# Patient Record
Sex: Male | Born: 1970 | Race: White | Hispanic: No | Marital: Married | Smoking: Current every day smoker
Health system: Southern US, Community
[De-identification: ages and names within clinical notes are randomized; demographics above are authoritative.]

## PROBLEM LIST (undated history)

## (undated) DIAGNOSIS — N200 Calculus of kidney: Secondary | ICD-10-CM

## (undated) DIAGNOSIS — I10 Essential (primary) hypertension: Secondary | ICD-10-CM

## (undated) DIAGNOSIS — K219 Gastro-esophageal reflux disease without esophagitis: Secondary | ICD-10-CM

## (undated) DIAGNOSIS — I499 Cardiac arrhythmia, unspecified: Secondary | ICD-10-CM

## (undated) DIAGNOSIS — E785 Hyperlipidemia, unspecified: Secondary | ICD-10-CM

## (undated) DIAGNOSIS — R109 Unspecified abdominal pain: Secondary | ICD-10-CM

## (undated) DIAGNOSIS — I4891 Unspecified atrial fibrillation: Secondary | ICD-10-CM

## (undated) HISTORY — DX: Calculus of kidney: N20.0

## (undated) HISTORY — DX: Unspecified atrial fibrillation: I48.91

## (undated) HISTORY — DX: Cardiac arrhythmia, unspecified: I49.9

## (undated) HISTORY — PX: KNEE SURGERY: SHX244

## (undated) HISTORY — DX: Unspecified abdominal pain: R10.9

## (undated) HISTORY — DX: Hyperlipidemia, unspecified: E78.5

## (undated) HISTORY — DX: Gastro-esophageal reflux disease without esophagitis: K21.9

---

## 2004-09-19 ENCOUNTER — Other Ambulatory Visit: Payer: Self-pay

## 2004-09-19 ENCOUNTER — Inpatient Hospital Stay: Payer: Self-pay | Admitting: Internal Medicine

## 2004-09-20 ENCOUNTER — Other Ambulatory Visit: Payer: Self-pay

## 2004-09-26 ENCOUNTER — Ambulatory Visit: Payer: Self-pay | Admitting: Internal Medicine

## 2006-11-14 ENCOUNTER — Emergency Department: Payer: Self-pay | Admitting: Emergency Medicine

## 2007-06-13 ENCOUNTER — Ambulatory Visit: Payer: Self-pay | Admitting: Internal Medicine

## 2007-06-25 ENCOUNTER — Other Ambulatory Visit: Payer: Self-pay

## 2007-06-26 ENCOUNTER — Observation Stay: Payer: Self-pay | Admitting: Internal Medicine

## 2012-07-07 ENCOUNTER — Emergency Department: Payer: Self-pay | Admitting: Emergency Medicine

## 2012-07-07 LAB — COMPREHENSIVE METABOLIC PANEL
Alkaline Phosphatase: 142 U/L — ABNORMAL HIGH (ref 50–136)
Calcium, Total: 9.2 mg/dL (ref 8.5–10.1)
Chloride: 107 mmol/L (ref 98–107)
Creatinine: 0.64 mg/dL (ref 0.60–1.30)
Glucose: 108 mg/dL — ABNORMAL HIGH (ref 65–99)
Osmolality: 284 (ref 275–301)
SGOT(AST): 25 U/L (ref 15–37)
Sodium: 141 mmol/L (ref 136–145)

## 2012-07-07 LAB — ETHANOL: Ethanol: <3 mg/dL

## 2012-07-07 LAB — CBC
HCT: 44.8 % (ref 40.0–52.0)
HGB: 15.4 g/dL (ref 13.0–18.0)
RDW: 13.7 % (ref 11.5–14.5)

## 2012-07-07 LAB — TROPONIN I: Troponin-I: <0.02 ng/mL

## 2012-07-07 LAB — CK TOTAL AND CKMB (NOT AT ARMC)
CK, Total: 117 U/L (ref 35–232)
CK-MB: 0.6 ng/mL (ref 0.5–3.6)

## 2012-07-07 LAB — LIPASE, BLOOD: Lipase: 48 U/L — ABNORMAL LOW (ref 73–393)

## 2013-08-11 ENCOUNTER — Ambulatory Visit: Payer: Self-pay | Admitting: Internal Medicine

## 2014-03-01 DIAGNOSIS — R10816 Epigastric abdominal tenderness: Secondary | ICD-10-CM

## 2014-03-01 DIAGNOSIS — R1031 Right lower quadrant pain: Secondary | ICD-10-CM

## 2014-03-01 LAB — CBC WITH DIFFERENTIAL/PLATELET
Basophil #: 0 10*3/uL (ref 0.0–0.1)
Basophil %: 0.3 %
Eosinophil #: 0.4 10*3/uL (ref 0.0–0.7)
Eosinophil %: 3.3 %
HCT: 48.1 % (ref 40.0–52.0)
HGB: 16.7 g/dL (ref 13.0–18.0)
Lymphocyte #: 2.6 10*3/uL (ref 1.0–3.6)
Lymphocyte %: 21.9 %
MCH: 30.2 pg (ref 26.0–34.0)
MCHC: 34.6 g/dL (ref 32.0–36.0)
MCV: 87 fL (ref 80–100)
MONO ABS: 0.8 x10 3/mm (ref 0.2–1.0)
MONOS PCT: 6.6 %
NEUTROS ABS: 7.9 10*3/uL — AB (ref 1.4–6.5)
NEUTROS PCT: 67.9 %
Platelet: 235 10*3/uL (ref 150–440)
RBC: 5.51 10*6/uL (ref 4.40–5.90)
RDW: 13.2 % (ref 11.5–14.5)
WBC: 11.7 10*3/uL — AB (ref 3.8–10.6)

## 2014-03-01 LAB — BASIC METABOLIC PANEL
Anion Gap: 8 (ref 7–16)
BUN: 21 mg/dL — ABNORMAL HIGH (ref 7–18)
CALCIUM: 8.7 mg/dL (ref 8.5–10.1)
CO2: 28 mmol/L (ref 21–32)
CREATININE: 0.73 mg/dL (ref 0.60–1.30)
Chloride: 103 mmol/L (ref 98–107)
EGFR (Non-African Amer.): >60
GLUCOSE: 89 mg/dL (ref 65–99)
OSMOLALITY: 280 (ref 275–301)
Potassium: 3.1 mmol/L — ABNORMAL LOW (ref 3.5–5.1)
Sodium: 139 mmol/L (ref 136–145)

## 2014-03-02 ENCOUNTER — Encounter: Payer: Self-pay | Admitting: General Surgery

## 2014-03-02 ENCOUNTER — Inpatient Hospital Stay: Payer: Self-pay | Admitting: General Surgery

## 2014-03-02 LAB — CBC WITH DIFFERENTIAL/PLATELET
Basophil #: 0 10*3/uL (ref 0.0–0.1)
Basophil %: 0.1 %
Eosinophil #: 0.2 10*3/uL (ref 0.0–0.7)
Eosinophil %: 1.9 %
HCT: 44.3 % (ref 40.0–52.0)
HGB: 14.9 g/dL (ref 13.0–18.0)
Lymphocyte #: 1.4 10*3/uL (ref 1.0–3.6)
Lymphocyte %: 11.6 %
MCH: 29.9 pg (ref 26.0–34.0)
MCHC: 33.6 g/dL (ref 32.0–36.0)
MCV: 89 fL (ref 80–100)
Monocyte #: 0.7 x10 3/mm (ref 0.2–1.0)
Monocyte %: 5.8 %
Neutrophil #: 9.4 10*3/uL — ABNORMAL HIGH (ref 1.4–6.5)
Neutrophil %: 80.6 %
Platelet: 204 10*3/uL (ref 150–440)
RBC: 4.99 10*6/uL (ref 4.40–5.90)
RDW: 12.9 % (ref 11.5–14.5)
WBC: 11.7 10*3/uL — ABNORMAL HIGH (ref 3.8–10.6)

## 2014-03-02 LAB — LIPASE, BLOOD: Lipase: 52 U/L — ABNORMAL LOW (ref 73–393)

## 2014-03-02 LAB — POTASSIUM: Potassium: 3.3 mmol/L — ABNORMAL LOW (ref 3.5–5.1)

## 2014-03-04 LAB — CBC WITH DIFFERENTIAL/PLATELET
BASOS PCT: 0.3 %
Basophil #: 0 10*3/uL (ref 0.0–0.1)
EOS ABS: 0.3 10*3/uL (ref 0.0–0.7)
Eosinophil %: 3.4 %
HCT: 38.7 % — ABNORMAL LOW (ref 40.0–52.0)
HGB: 13.7 g/dL (ref 13.0–18.0)
Lymphocyte #: 2.3 10*3/uL (ref 1.0–3.6)
Lymphocyte %: 29.5 %
MCH: 30.6 pg (ref 26.0–34.0)
MCHC: 35.4 g/dL (ref 32.0–36.0)
MCV: 87 fL (ref 80–100)
Monocyte #: 0.7 x10 3/mm (ref 0.2–1.0)
Monocyte %: 8.5 %
Neutrophil #: 4.5 10*3/uL (ref 1.4–6.5)
Neutrophil %: 58.3 %
PLATELETS: 205 10*3/uL (ref 150–440)
RBC: 4.48 10*6/uL (ref 4.40–5.90)
RDW: 13 % (ref 11.5–14.5)
WBC: 7.7 10*3/uL (ref 3.8–10.6)

## 2014-03-04 LAB — BASIC METABOLIC PANEL
ANION GAP: 6 — AB (ref 7–16)
BUN: 9 mg/dL (ref 7–18)
CO2: 32 mmol/L (ref 21–32)
Calcium, Total: 7.7 mg/dL — ABNORMAL LOW (ref 8.5–10.1)
Chloride: 102 mmol/L (ref 98–107)
Creatinine: 0.79 mg/dL (ref 0.60–1.30)
EGFR (African American): >60
EGFR (Non-African Amer.): >60
Glucose: 106 mg/dL — ABNORMAL HIGH (ref 65–99)
OSMOLALITY: 279 (ref 275–301)
POTASSIUM: 3.4 mmol/L — AB (ref 3.5–5.1)
Sodium: 140 mmol/L (ref 136–145)

## 2014-03-07 ENCOUNTER — Encounter: Payer: Self-pay | Admitting: General Surgery

## 2014-03-08 LAB — STOOL CULTURE

## 2014-03-12 ENCOUNTER — Telehealth: Payer: Self-pay | Admitting: *Deleted

## 2014-03-12 NOTE — Telephone Encounter (Signed)
Patient's employer called needing a note from you to return back to work. His post op appointment is not until 03/15/14.

## 2014-03-15 ENCOUNTER — Encounter: Payer: Self-pay | Admitting: General Surgery

## 2014-03-15 ENCOUNTER — Telehealth: Payer: Self-pay | Admitting: General Surgery

## 2014-03-15 ENCOUNTER — Ambulatory Visit (INDEPENDENT_AMBULATORY_CARE_PROVIDER_SITE_OTHER): Payer: No Typology Code available for payment source | Admitting: General Surgery

## 2014-03-15 VITALS — BP 130/74 | HR 80 | Resp 14 | Ht 72.0 in | Wt 187.0 lb

## 2014-03-15 DIAGNOSIS — R109 Unspecified abdominal pain: Secondary | ICD-10-CM

## 2014-03-15 NOTE — Telephone Encounter (Signed)
03-15-14  I A L/M FOR MR. Graser TO CALL BACK REGARDING, WHO REF'D HIM TO DR Darron DoomBYRNETT./MTH

## 2014-03-15 NOTE — Patient Instructions (Signed)
Patient advised to discontinue Bentyl and Doxycycline. Patient advised to give us a call in the beginning of the week with an update.

## 2014-03-15 NOTE — Progress Notes (Signed)
Patient ID: Trevor Franklin, male   DOB: June 20, 1970, 43 y.o.   MRN: [id number]  Chief Complaint  Patient presents with  . Routine Post Op    colonoscopy    HPI Trevor Franklin is a 43 y.o. male who presents for a follow up from colonoscopy and recent hospitalization for abdominal pain.. The procedure was performed on 03/05/14.  He was hospitalized 03/01/2014 with abdominal pain and suspected dehydration. He initially had pain in the lower abdomen and then in the upper abdomen. He had been unable to take any oral intake in the 36 hours prior to presentation. During his hospitalization he underwent upper endoscopy and colonoscopy with the GI service. There is a question laterality on CT scan involving the gastric antrum, with no associated lesion noted on endoscopy. He is likely a candidate for an EUS exam to be arranged by Dow AdolphMatthew Rein, M.D. the GI service.     He states he has pain with swallowing. He feels that he has a lot of acid reflux in this mid chest. He describes it as really intense when he swallows. He states she is new since he left the hospital. The omeprazole that he takes doesn't seem to help it. For the most part it is there all the time. Eating and drink cause this sensation. Warmer liquids and food make it worse. He was placed on antibiotics times discharge, these may be contributing to his present increased GI distress. Otherwise he is doing well and has returned to work.   HPI  Past Medical History  Diagnosis Date  . GERD (gastroesophageal reflux disease)   . Kidney stones   . Abdominal pain   . A-fib   . Hyperlipidemia     Past Surgical History  Procedure Laterality Date  . Knee surgery      age 89    Family History  Problem Relation Age of Onset  . Heart disease Mother     Social History History  Substance Use Topics  . Smoking status: Current Every Day Smoker -- 1.00 packs/day for 30 years  . Smokeless tobacco: Never Used  . Alcohol Use: Yes    Allergies   Allergen Reactions  . Penicillins Other (See Comments)    unknown    Current Outpatient Prescriptions  Medication Sig Dispense Refill  . aspirin 81 MG tablet Take 81 mg by mouth daily.    Marland Kitchen. atorvastatin (LIPITOR) 40 MG tablet Take 40 mg by mouth every evening.  5  . dicyclomine (BENTYL) 10 MG capsule Take 10 mg by mouth daily.  0  . diltiazem (TIAZAC) 240 MG 24 hr capsule Take 240 mg by mouth daily.  5  . doxycycline (VIBRA-TABS) 100 MG tablet Take 100 mg by mouth 2 (two) times daily.  0  . omeprazole (PRILOSEC) 20 MG capsule Take 20 mg by mouth daily.  5  . POTASSIUM PO Take 1 tablet by mouth daily.     No current facility-administered medications for this visit.    Review of Systems Review of Systems  Constitutional: Negative.   Respiratory: Negative.   Cardiovascular: Negative.   Gastrointestinal: Negative.     Blood pressure 130/74, pulse 80, resp. rate 14, height 6' (1.829 m), weight 187 lb (84.823 kg).  Physical Exam Physical Exam  Constitutional: He is oriented to person, place, and time. He appears well-developed and well-nourished.  Cardiovascular: Normal rate, regular rhythm and normal heart sounds.   No murmur heard. Pulmonary/Chest: Effort normal and breath sounds normal.  Abdominal: Soft. Normal appearance and bowel sounds are normal. There is no tenderness.  Neurological: He is alert and oriented to person, place, and time.  Skin: Skin is warm and dry.    Data Reviewed Hospital records. Biopsies of the terminal ileum showed only lymphoid aggregates. Assessment    Abdominal pain, unclear etiology. Abnormal imaging of the tissue adjacent to the gastric antrum.    Plan    The patient will discontinue his antibiotics as well as the Bentyl, which has not provided any significant improvement in his abdominal discomfort. He'll give a phone report in 72 hours. He'll follow up with the GI services previously scheduled regarding his EUA exam.         Earline MayotteByrnett, Altheia Shafran W 03/17/2014, 9:07 AM

## 2014-03-17 DIAGNOSIS — R109 Unspecified abdominal pain: Secondary | ICD-10-CM | POA: Insufficient documentation

## 2014-03-29 ENCOUNTER — Telehealth: Payer: Self-pay | Admitting: *Deleted

## 2014-03-29 NOTE — Telephone Encounter (Signed)
-----   Message from Earline MayotteJeffrey W Byrnett, MD sent at 03/29/2014  8:36 AM EST ----- Please check with the patient and see if he is had a follow-up scheduled with the GI service or arrangements made for an endoscopic ultrasound. Also, remind him he should get his blood pressure rechecked in the next couple of weeks as his blood pressure medicine was discontinued at the time of his hospitalization. His blood pressure when he was here was fine, but I expect it will start to trend upward.

## 2014-03-29 NOTE — Telephone Encounter (Signed)
I talked with patient and he said he saw Dr Shelle Ironein and has an endoscopy scheduled for January but unsure of exact date. His blood pressure is trending up 150/98 but he was taking it for "flutter" feelings with his heart rate and he has noticed them recently. He plans on calling Dr Maree Krabbeate's office regarding the restart of his medication. Appreciates phone call.

## 2014-04-30 ENCOUNTER — Encounter: Payer: Self-pay | Admitting: *Deleted

## 2014-07-21 NOTE — H&P (Signed)
PATIENT NAME:  Trevor Franklin, Trevor Franklin MR#:  [id number] DATE OF BIRTH:  05-23-1970  DATE OF ADMISSION:  03/01/2014  INDICATION FOR ADMISSION: Abdominal pain, suspected dehydration.   CLINICAL NOTE: This 44 year old male who works for a [employer] reported he was in his usual health on the evening of November 30th. He was awakened early on the morning December 1st with a sense of nausea. This did not preclude his going to work. During the course of the day, he began to develop some pain in the hypogastrium that progressed into the evening. During this time he appreciated a decreased appetite and had 2 episodes of watery stools. While he felt chilled and reported bundling under blankets, he did not record his temperature. He stayed home from work yesterday, which is quite uncommon by his wife's report, because he was feeling so poorly. He reported essentially no oral intake yesterday with persistent pain in the hypogastrium that was in part alleviated by remaining immobile. He had no further diarrhea and denied any vomiting. Today he has had only a few small sips of water, reporting that if he takes even a small volume of fluid it feels as if it is generating severe pressure in his lower abdomen. He was evaluated in the office of Trevor Mansonichard Gilbert, MD (his primary physician, Trevor Oatsenny Tate, MD being out of the office today). His examination and history warranted observation and IV fluids while diagnostic imaging was completed.   PAST MEDICAL HISTORY: Notable for smoking. Rare alcohol use. He does make use of sucralfate 1 gram daily. He is on pravastatin for control of his lipids. He makes use of potassium gluconate. He makes use of omeprazole or Nexium. He has been maintained on diltiazem for blood pressure and palpitations. He makes use of a daily aspirin.   ALLERGIES: REPORTED ALLERGIES CONSIST OF PENICILLIN WITH THE REACTION BEING UNKNOWN.   PHYSICAL EXAMINATION: VITAL SIGNS: On admission to the hospital his  temperature was 98, his pulse was 76, respirations of 14 and blood pressure of 144/95.  GENERAL: When examined in the office he moved very hesitantly from the standing to the supine position.  HEAD AND NECK: Unremarkable.  CHEST: Clear to auscultation.  CARDIAC: Showed a regular rhythm with a grade 1/6 systolic murmur.  ABDOMEN: Nondistended. Bowel sounds were present. There is moderate tenderness in the hypogastrium with minimal discomfort in the right more so than left lower quadrant. There is no peritoneal irritation, guarding or referred pain. Femoral pulses were normal. No inguinal adenopathy.   LABORATORY DATA: The patient subsequently underwent laboratory studies with a CBC showing a modest elevation of white blood cell count 11,700 with normal differential. Hemoglobin 16.7 consistent with his smoking status, of an MCV of 87. Electrolytes were notable for a modest depression of the serum potassium at 3.1. Creatinine normal at 0.73. Normal blood sugar.   IMPRESSION: Abdominal pain, diverticulitis versus appendicitis versus gastroenteritis.   PLAN: The patient will undergo CT scan of the abdomen and pelvis with IV and oral contrast later this evening and further management and plans based on the results of that study.   ____________________________ Trevor MayotteJeffrey W. Marx Doig, MD jwb:TT D: 03/01/2014 20:10:14 ET T: 03/01/2014 20:58:26 ET JOB#: [id number]  cc: Trevor MayotteJeffrey W. Joriel Streety, MD, <Dictator> Trevor L. Sullivan LoneGilbert, MD Trevor Bucksenny C. Arlana Pouchate, MD Trevor Dickard Brion AlimentW Quierra Silverio MD ELECTRONICALLY SIGNED 03/05/2014 10:06

## 2014-07-21 NOTE — Consult Note (Signed)
Details:   - GI NOte:  No significant findings on EGD or colonoscopy to correlated with CT findings or symptoms.   Likely some component of functional abd pain.   Recs: - bentyl for spasm - f/u t ileum biopsies.  - GI o/v f/u. - consider outpt EUS of gastric antrum given the repeated abnormality on CT   Electronic Signatures: Dow Adolphein, Eswin Worrell (MD)  (Signed 07-Dec-15 17:03)  Authored: Details   Last Updated: 07-Dec-15 17:03 by Dow Adolphein, Kee Drudge (MD)

## 2014-07-23 LAB — SURGICAL PATHOLOGY

## 2014-07-25 NOTE — Discharge Summary (Signed)
PATIENT NAME:  Trevor Franklin, Trevor Franklin MR#:  [id number] DATE OF BIRTH:  [birth year]  DATE OF ADMISSION:  03/02/2014 DATE OF DISCHARGE:  03/06/2014  DISCHARGE DIAGNOSIS: Abdominal pain, abnormal CT.   CLINICAL NOTE: This 44 year old male was admitted with a 2-day history of inability to tolerate any fluids and vomiting. He had been in his usual health until the evening of November 30, when he was awakened with nausea. The following day he noticed pain in the epigastrium that had became more pronounced. He experienced watery diarrhea. He was admitted for IV hydration and further evaluation.   The patient had a past history of abnormal imaging of the area around the gastric antrum. Clinical impression at the time of admission was possible gastroenteritis/diverticulitis/appendicitis.   The patient underwent a CT scan the day of admission that did not show evidence of appendicitis. It did confirm persistent abnormality in the area of the gastric antrum. He was placed on IV antibiotics, Pepcid, and had his low potassium replenished. He was seen in consultation by Dr. Dow AdolphMatthew Rein from gastroenterology, who completed a colonoscopy and upper endoscopy during his hospitalization. The colonoscopy showed mild erythema of the mucosa of the terminal ileum, as well as diverticulosis. The upper endoscopy showed some mild erythema in the duodenal bulb, but a normal stomach.   His diarrhea abated and he was started on a clear liquid diet. This was well tolerated and he was gradually advanced to a soft diet. Arrangements were to be made with the GI service for endoscopic ultrasound to evaluate the abnormal findings on CT. The CT had shown segmental mucosal thickening and soft tissue inflammation around the distal gastric antrum and pylorus with mild abnormal enhancement. Inflammation along the ascending and proximal transverse colon was also identified. There was a small amount of free fluid in the right paracolic gutter and  surrounding the liver. The appendix was felt to be grossly normal.   LABORATORY STUDIES: On admission showed a white blood cell count of 11,700, which fell to 7,700 by the time of discharge. Hemoglobin was 16.7 on admission, 13.7 on discharge. Electrolytes were notable for a mild depression of the serum potassium at 3.1. Lipase was normal at 52. Stool was negative for enteric pathogens. Sample for Giardia was negative.   Biopsies of the small bowel showed small intestinal mucosa with focal reactive lymphoid hyperplasia.   DISCHARGE MEDICATIONS: The patient was discharged home on aspirin 81 mg per day, pravastatin 40 mg daily, potassium gluconate 595 mg daily, omeprazole 20 mg daily, Mylanta 15 mL p.o. q.4 h. p.r.n., dicyclomine 10 mg every 8 hours as needed, doxycycline 100 mg b.i.d., and Tylenol as needed for pain.   He has to withhold his diltiazem pending outpatient followup.   FOLLOWUP: Arrangements were to be made with Dr. Teddy Spikeein's office in the future. Followup in my office would be within 7 to 10 days.    ____________________________ Earline MayotteJeffrey W. Latrese Carolan, MD jwb:JT D: 03/29/2014 08:36:00 ET T: 03/29/2014 08:54:17 ET JOB#: [id number]  cc: Jillene Bucksenny C. Arlana Pouchate, MD Dow AdolphMatthew Rein, MD Richard L. Sullivan LoneGilbert, MD Earline MayotteJeffrey W. Zeena Starkel, MD, <Dictator>   Beni Turrell Brion AlimentW Charnay Nazario MD ELECTRONICALLY SIGNED 03/30/2014 9:33

## 2014-07-25 NOTE — Consult Note (Signed)
PATIENT NAME:  Trevor Franklin, Trevor Franklin MR#:  [id number] DATE OF BIRTH:  [birth year]  DATE OF CONSULTATION:  03/02/2014  REFERRING PHYSICIAN:   CONSULTING PHYSICIAN:  Dow Adolph, MD  REFERRING PHYSICIAN:  Dr. Donnalee Curry.   REASON FOR CONSULTATION: Abdominal pain, abnormal CT scan.   HISTORY OF PRESENT ILLNESS: Trevor Franklin is a 44 year old male with a past medical history notable for hyperlipidemia, GERD, hypertension, palpitations, who  presented to the Emergency Room for evaluation of diffuse abdominal pain and hypogastric abdominal pain. Mr. Jeppsen reports he was in his usual state of health until approximately 5 days ago. At that time he developed periumbilical and hypogastric abdominal pain. He also had some nausea, but did not have any vomiting until today. He also does report that he has been having loose stools the past several days. Yesterday he had 3-4 loose stools, today he has had 1-2 loose stools. He has not had any blood in his stools and has not had any black stools.   Of note, Trevor Franklin was seen by Dr. Bluford Kaufmann and Owens Shark in April of 2014 for abdominal pain and an abnormal CT scan. He had evidence of antral thickening at that time. He did undergo an upper endoscopy with Dr. Bluford Kaufmann at that time which was normal and also had biopsies that were unremarkable.   He has never had a colonoscopy. He is unsure if his symptoms at the time he was seen by Dr. Bluford Kaufmann  and Owens Shark were similar to these symptoms or not. Of note he has lost about 50 pounds over the past 1-2 years. He attributes this to cutting out soda out of his diet.   PAST MEDICAL HISTORY:  1. Hyperlipidemia.  2. GERD.  3. Hypertension.  4. Palpitations.   ALLERGIES: PENICILLIN.   SOCIAL HISTORY: Ongoing smoker. Some occasional alcohol.   FAMILY HISTORY: No family history of GI malignancy.   REVIEW OF SYSTEMS: A 10 system review was conducted and negative except as stated in the HPI.    PHYSICAL EXAMINATION:  VITAL SIGNS: His  temperature is 98. Pulse is 68. Respirations are 18. Blood pressure is 109/69. Pulse oximetry is 95% on room air.  GENERAL: Alert and oriented times 4.  No acute distress. Appears stated age. HEENT: Normocephalic/atraumatic. Extraocular movements are intact. Anicteric. NECK: Soft,supple. JVP appears normal. No adenopathy. CHEST: Clear to auscultation. No wheeze or crackle. Respirations unlabored. HEART: Regular. No murmur, rub, or gallop.  Normal S1 and S2. ABDOMEN: Mild periumbilical and hypogastric tenderness to palpation. Normoactive bowel sounds. Soft. No rebound or guarding.  EXTREMITIES: No swelling, well perfused. SKIN: No rash or lesion. Skin color, texture, turgor normal. NEUROLOGICAL: Grossly intact. PSYCHIATRIC: Normal tone and affect. MUSCULOSKELETAL: No joint swelling or erythema.   MEDICATIONS:  Pravastatin, potassium gluconate, omeprazole, diltiazem, aspirin.   LABORATORY DATA: Sodium 139, potassium 3.1, BUN 21, creatinine 0.73. Lipase is normal. White count is 11.7, hemoglobin is 15, hematocrit 44, platelets are 204,000. He did have a CT scan and on the CT scan he did have mucosal thickening and inflammation in the distal gastric antrum and pylorus, he also had mucosal thickening of the ascending and proximal colon with some trace fluid in the abdomen and also around the terminal ileum.   ASSESSMENT AND PLAN:  Hypogastric and generalized abdominal pain, weight loss, abnormal CT scan: It is not clear whether these are 2 separate processes in the colon and in the antrum or whether they are related. It is somewhat concerning that  he also did have thickening in the antrum over a year ago which was worked up with an upper endoscopy. It does not seem that he has been having any symptoms since that workup although I am not sure we can really attribute this large weight loss only to stopping drinking sodas.   RECOMMENDATIONS:   1.  We will perform an upper endoscopy to reexamine the  abnormal antrum since it has been abnormal on 2 consecutive CT scans.  2.  If the upper endoscopy is unremarkable will likely need EUS to rule out deeper involvement. 3.  We will obtain stool studies for infection. If stool studies are negative we will plan for colonoscopy likely on Monday to investigate the descending and transverse colon thickening. 4.  Thank you for this consult. We will continue to follow.    ____________________________ Dow AdolphMatthew Rein, MD mr:bu D: 03/02/2014 17:34:50 ET T: 03/02/2014 20:13:35 ET JOB#: [id number]  cc: Dow AdolphMatthew Rein, MD, <Dictator> Kathalene FramesMATTHEW G REIN MD ELECTRONICALLY SIGNED 04/02/2014 14:16

## 2014-08-15 ENCOUNTER — Telehealth: Payer: Self-pay | Admitting: Cardiovascular Disease

## 2014-08-15 NOTE — Telephone Encounter (Signed)
Contacted by Dr. Arlana Pouchate to see the patient in the office for chest pain. Hx of smoking, atrial fibrillation, remote history of syncope  We need to call him at [phone], [phone] For new patient appointment

## 2014-08-22 ENCOUNTER — Encounter: Payer: Self-pay | Admitting: Cardiovascular Disease

## 2014-08-22 ENCOUNTER — Encounter (INDEPENDENT_AMBULATORY_CARE_PROVIDER_SITE_OTHER): Payer: Self-pay

## 2014-08-22 ENCOUNTER — Ambulatory Visit (INDEPENDENT_AMBULATORY_CARE_PROVIDER_SITE_OTHER): Payer: No Typology Code available for payment source | Admitting: Cardiovascular Disease

## 2014-08-22 ENCOUNTER — Other Ambulatory Visit: Payer: Self-pay | Admitting: Cardiovascular Disease

## 2014-08-22 VITALS — BP 132/90 | HR 80 | Ht 73.0 in | Wt 192.2 lb

## 2014-08-22 DIAGNOSIS — R079 Chest pain, unspecified: Secondary | ICD-10-CM

## 2014-08-22 DIAGNOSIS — I4891 Unspecified atrial fibrillation: Secondary | ICD-10-CM | POA: Diagnosis not present

## 2014-08-22 DIAGNOSIS — Z720 Tobacco use: Secondary | ICD-10-CM | POA: Diagnosis not present

## 2014-08-22 DIAGNOSIS — I471 Supraventricular tachycardia: Secondary | ICD-10-CM | POA: Diagnosis not present

## 2014-08-22 DIAGNOSIS — F17200 Nicotine dependence, unspecified, uncomplicated: Secondary | ICD-10-CM | POA: Insufficient documentation

## 2014-08-22 DIAGNOSIS — E785 Hyperlipidemia, unspecified: Secondary | ICD-10-CM | POA: Insufficient documentation

## 2014-08-22 DIAGNOSIS — I480 Paroxysmal atrial fibrillation: Secondary | ICD-10-CM | POA: Insufficient documentation

## 2014-08-22 NOTE — Assessment & Plan Note (Signed)
Several risk factors for coronary artery disease including long history of smoking, strong family history of coronary artery disease/hyperlipidemia Given his chest pain, we will order a stress test to rule out ischemia. Currently with upper respiratory infection. Suspect he will not be able to treadmill. We'll order a pharmacologic Myoview

## 2014-08-22 NOTE — Progress Notes (Signed)
Patient ID: AZAREL BANNER, male    DOB: 01/22/1971, 44 y.o.   MRN: [id number]  HPI Comments:  Mr. Luhmann is a 44 year old gentleman with a long history of smoking and continues to smoke, history of arrhythmia, possibly SVT though he reports also having atrial fibrillation, who presents for evaluation of chest pain. He presents with his wife.  He reports that last week he was trimming hedges. After he was done, he was walking back to his house when he developed chest discomfort. He felt that he had pulled some muscles The next night he developed short run of tachycardia up to 30 seconds, then developed a headache  Since then he has had symptoms of chest burning, left arm burning He went to [facility] emergency room on 08/07/2014. Cardiac enzymes were negative and he was sent home Since then he has had continued episodes of chest pain and arm burning Also reports having some extra beats, palpitations. He seemed to be positional in nature, worse when he bends down and stands up.  He is concerned about coronary artery disease as he has a strong family history Mother had MI and died at age 52 Father with strokes  Other past medical history He reports having a history of syncope many years ago  Workup at that time included Holter monitors He was taking Chantix at the time.   He does report history of prolonged arrhythmia, possibly SVT. By his account, he was given a shot, possibly adenosine to break the rhythm. Started on diltiazem at that time  EKG on today's visit shows normal sinus rhythm with rate 80 bpm, no significant ST or T-wave changes   Allergies  Allergen Reactions  . Penicillins Other (See Comments)    unknown    Current Outpatient Prescriptions on File Prior to Visit  Medication Sig Dispense Refill  . aspirin 81 MG tablet Take 81 mg by mouth daily.    Marland Kitchen atorvastatin (LIPITOR) 40 MG tablet Take 40 mg by mouth every evening.  5  . diltiazem (TIAZAC) 240 MG 24 hr capsule Take 240  mg by mouth daily.  5  . omeprazole (PRILOSEC) 20 MG capsule Take 20 mg by mouth daily.  5  . POTASSIUM PO Take 1 tablet by mouth daily.     No current facility-administered medications on file prior to visit.    Past Medical History  Diagnosis Date  . GERD (gastroesophageal reflux disease)   . Abdominal pain   . A-fib   . Hyperlipidemia   . Arrhythmia   . Kidney stones     Past Surgical History  Procedure Laterality Date  . Knee surgery      age 48    Social History  reports that he has been smoking.  He has never used smokeless tobacco. He reports that he drinks alcohol. He reports that he does not use illicit drugs.  Family History family history includes Heart attack in his mother; Heart disease in his mother.   Review of Systems  Constitutional: Negative.   HENT: Negative.   Eyes: Negative.   Respiratory: Negative.   Cardiovascular: Positive for chest pain and palpitations.  Gastrointestinal: Negative.   Endocrine: Negative.   Musculoskeletal:       Arm burning  Skin: Negative.   Allergic/Immunologic: Negative.   Neurological: Negative.   Hematological: Negative.   Psychiatric/Behavioral: Negative.   All other systems reviewed and are negative.  BP 132/90 mmHg  Pulse 80  Ht  (1.854 m)  Wt 192 lb 4 oz (87.204 kg)  BMI 25.37 kg/m2   Physical Exam  Constitutional: He is oriented to person, place, and time. He appears well-developed and well-nourished.  HENT:  Head: Normocephalic.  Nose: Nose normal.  Mouth/Throat: Oropharynx is clear and moist.  Eyes: Conjunctivae are normal. Pupils are equal, round, and reactive to light.  Neck: Normal range of motion. Neck supple. No JVD present.  Cardiovascular: Normal rate, regular rhythm, normal heart sounds and intact distal pulses.  Exam reveals no gallop and no friction rub.   No murmur heard. Pulmonary/Chest: Effort normal and breath sounds normal. No respiratory distress. He has no wheezes. He has no  rales. He exhibits no tenderness.  Abdominal: Soft. Bowel sounds are normal. He exhibits no distension. There is no tenderness.  Musculoskeletal: Normal range of motion. He exhibits no edema or tenderness.  Lymphadenopathy:    He has no cervical adenopathy.  Neurological: He is alert and oriented to person, place, and time. Coordination normal.  Skin: Skin is warm and dry. No rash noted. No erythema.  Psychiatric: He has a normal mood and affect. His behavior is normal. Judgment and thought content normal.

## 2014-08-22 NOTE — Assessment & Plan Note (Signed)
Reports having 25 to 30 sec run of tachycardia. Suspect this could be secondary to SVT given his symptoms Recommended he call us if he has increasing frequency of symptoms

## 2014-08-22 NOTE — Assessment & Plan Note (Signed)
Encouraged him to stay on his Lipitor 

## 2014-08-22 NOTE — Patient Instructions (Addendum)
No medication changes were made.  We will order a stress test for chest pain  Please call us if you have new issues that need to be addressed before your next appt.  Follow up in one year  [facility] MYOVIEW  Your caregiver has ordered a Stress Test with nuclear imaging. The purpose of this test is to evaluate the blood supply to your heart muscle. This procedure is referred to as a "Non-Invasive Stress Test." This is because other than having an IV started in your vein, nothing is inserted or "invades" your body. Cardiac stress tests are done to find areas of poor blood flow to the heart by determining the extent of coronary artery disease (CAD). Some patients exercise on a treadmill, which naturally increases the blood flow to your heart, while others who are  unable to walk on a treadmill due to physical limitations have a pharmacologic/chemical stress agent called Lexiscan . This medicine will mimic walking on a treadmill by temporarily increasing your coronary blood flow.   Please note: these test may take anywhere between 2-4 hours to complete  PLEASE REPORT TO [facility] MEDICAL MALL ENTRANCE  THE VOLUNTEERS AT THE FIRST DESK WILL DIRECT YOU WHERE TO GO  Date of Procedure:______Friday, June 3__________  Arrival Time for Procedure:______7:45 am__________   PLEASE NOTIFY THE OFFICE AT LEAST 24 HOURS IN ADVANCE IF YOU ARE UNABLE TO KEEP YOUR APPOINTMENT.  [phone] AND  PLEASE NOTIFY NUCLEAR MEDICINE AT [facility] AT LEAST 24 HOURS IN ADVANCE IF YOU ARE UNABLE TO KEEP YOUR APPOINTMENT. [phone]  How to prepare for your Myoview test:   Do not eat or drink after midnight  No caffeine for 24 hours prior to test  No smoking 24 hours prior to test.  Your medication may be taken with water.  If your doctor stopped a medication because of this test, do not take that medication.  Ladies, please do not wear dresses.  Skirts or pants are appropriate. Please wear a short sleeve shirt.  No  perfume, cologne or lotion.  Wear comfortable walking shoes. No heels!   Exercise Stress Electrocardiogram An exercise stress electrocardiogram is a test that is done to evaluate the blood supply to your heart. This test may also be called exercise stress electrocardiography. The test is done while you are walking on a treadmill. The goal of this test is to raise your heart rate. This test is done to find areas of poor blood flow to the heart by determining the extent of coronary artery disease (CAD).   CAD is defined as narrowing in one or more heart (coronary) arteries of more than 70%. If you have an abnormal test result, this may mean that you are not getting adequate blood flow to your heart during exercise. Additional testing may be needed to understand why your test was abnormal. LET [facility] CARE PROVIDER KNOW ABOUT:   Any allergies you have.  All medicines you are taking, including vitamins, herbs, eye drops, creams, and over-the-counter medicines.  Previous problems you or members of your family have had with the use of anesthetics.  Any blood disorders you have.  Previous surgeries you have had.  Medical conditions you have.  Possibility of pregnancy, if this applies. RISKS AND COMPLICATIONS Generally, this is a safe procedure. However, as with any procedure, complications can occur. Possible complications can include:  Pain or pressure in the following areas:  Chest.  Jaw or neck.  Between your shoulder blades.  Radiating down your left  arm.  Dizziness or light-headedness.  Shortness of breath.  Increased or irregular heartbeats.  Nausea or vomiting.  Heart attack (rare). BEFORE THE PROCEDURE  Avoid all forms of caffeine 24 hours before your test or as directed by your health care provider. This includes coffee, tea (even decaffeinated tea), caffeinated sodas, chocolate, cocoa, and certain pain medicines.  Follow your health care provider's instructions  regarding eating and drinking before the test.  Take your medicines as directed at regular times with water unless instructed otherwise. Exceptions may include:  If you have diabetes, ask how you are to take your insulin or pills. It is common to adjust insulin dosing the morning of the test.  If you are taking beta-blocker medicines, it is important to talk to your health care provider about these medicines well before the date of your test. Taking beta-blocker medicines may interfere with the test. In some cases, these medicines need to be changed or stopped 24 hours or more before the test.  If you wear a nitroglycerin patch, it may need to be removed prior to the test. Ask your health care provider if the patch should be removed before the test.  If you use an inhaler for any breathing condition, bring it with you to the test.  If you are an outpatient, bring a snack so you can eat right after the stress phase of the test.  Do not smoke for 24 hours prior to the test or as directed by your health care provider.  Do not apply lotions, powders, creams, or oils on your chest prior to the test.  Wear loose-fitting clothes and comfortable shoes for the test. This test involves walking on a treadmill. PROCEDURE  Multiple patches (electrodes) will be put on your chest. If needed, small areas of your chest may have to be shaved to get better contact with the electrodes. Once the electrodes are attached to your body, multiple wires will be attached to the electrodes and your heart rate will be monitored.  Your heart will be monitored both at rest and while exercising.  You will walk on a treadmill. The treadmill will be started at a slow pace. The treadmill speed and incline will gradually be increased to raise your heart rate. AFTER THE PROCEDURE  Your heart rate and blood pressure will be monitored after the test.  You may return to your normal schedule including diet, activities, and  medicines, unless your health care provider tells you otherwise. Document Released: 03/13/2000 Document Revised: 03/21/2013 Document Reviewed: 11/21/2012 [facility] Patient Information 2015 [employer] [place]. This information is not intended to replace advice given to you by your health care provider. Make sure you discuss any questions you have with your health care provider.   Smoking Cessation Quitting smoking is important to your health and has many advantages. However, it is not always easy to quit since nicotine is a very addictive drug. Oftentimes, people try 3 times or more before being able to quit. This document explains the best ways for you to prepare to quit smoking. Quitting takes hard work and a lot of effort, but you can do it. ADVANTAGES OF QUITTING SMOKING  You will live longer, feel better, and live better.  Your body will feel the impact of quitting smoking almost immediately.  Within 20 minutes, blood pressure decreases. Your pulse returns to its normal level.  After 8 hours, carbon monoxide levels in the blood return to normal. Your oxygen level increases.  After 24 hours, the  chance of having a heart attack starts to decrease. Your breath, hair, and body stop smelling like smoke.  After 48 hours, damaged nerve endings begin to recover. Your sense of taste and smell improve.  After 72 hours, the body is virtually free of nicotine. Your bronchial tubes relax and breathing becomes easier.  After 2 to 12 weeks, lungs can hold more air. Exercise becomes easier and circulation improves.  The risk of having a heart attack, stroke, cancer, or lung disease is greatly reduced.  After 1 year, the risk of coronary heart disease is cut in half.  After 5 years, the risk of stroke falls to the same as a nonsmoker.  After 10 years, the risk of lung cancer is cut in half and the risk of other cancers decreases significantly.  After 15 years, the risk of coronary heart disease  drops, usually to the level of a nonsmoker.  If you are pregnant, quitting smoking will improve your chances of having a healthy baby.  The people you live with, especially any children, will be healthier.  You will have extra money to spend on things other than cigarettes. QUESTIONS TO THINK ABOUT BEFORE ATTEMPTING TO QUIT You may want to talk about your answers with your health care provider.  Why do you want to quit?  If you tried to quit in the past, what helped and what did not?  What will be the most difficult situations for you after you quit? How will you plan to handle them?  Who can help you through the tough times? Your family? Friends? A health care provider?  What pleasures do you get from smoking? What ways can you still get pleasure if you quit? Here are some questions to ask your health care provider:  How can you help me to be successful at quitting?  What medicine do you think would be best for me and how should I take it?  What should I do if I need more help?  What is smoking withdrawal like? How can I get information on withdrawal? GET READY  Set a quit date.  Change your environment by getting rid of all cigarettes, ashtrays, matches, and lighters in your home, car, or work. Do not let people smoke in your home.  Review your past attempts to quit. Think about what worked and what did not. GET SUPPORT AND ENCOURAGEMENT You have a better chance of being successful if you have help. You can get support in many ways.  Tell your family, friends, and coworkers that you are going to quit and need their support. Ask them not to smoke around you.  Get individual, group, or telephone counseling and support. Programs are available at [employer] and health centers. Call your local health department for information about programs in your area.  Spiritual beliefs and practices may help some smokers quit.  Download a "quit meter" on your computer to keep track  of quit statistics, such as how long you have gone without smoking, cigarettes not smoked, and money saved.  Get a self-help book about quitting smoking and staying off tobacco. LEARN NEW SKILLS AND BEHAVIORS  Distract yourself from urges to smoke. Talk to someone, go for a walk, or occupy your time with a task.  Change your normal routine. Take a different route to work. Drink tea instead of coffee. Eat breakfast in a different place.  Reduce your stress. Take a hot bath, exercise, or read a book.  Plan something enjoyable to  do every day. Reward yourself for not smoking.  Explore interactive web-based programs that specialize in helping you quit. GET MEDICINE AND USE IT CORRECTLY Medicines can help you stop smoking and decrease the urge to smoke. Combining medicine with the above behavioral methods and support can greatly increase your chances of successfully quitting smoking.  Nicotine replacement therapy helps deliver nicotine to your body without the negative effects and risks of smoking. Nicotine replacement therapy includes nicotine gum, lozenges, inhalers, nasal sprays, and skin patches. Some may be available over-the-counter and others require a prescription.  Antidepressant medicine helps people abstain from smoking, but how this works is unknown. This medicine is available by prescription.  Nicotinic receptor partial agonist medicine simulates the effect of nicotine in your brain. This medicine is available by prescription. Ask your health care provider for advice about which medicines to use and how to use them based on your health history. Your health care provider will tell you what side effects to look out for if you choose to be on a medicine or therapy. Carefully read the information on the package. Do not use any other product containing nicotine while using a nicotine replacement product.  RELAPSE OR DIFFICULT SITUATIONS Most relapses occur within the first 3 months after  quitting. Do not be discouraged if you start smoking again. Remember, most people try several times before finally quitting. You may have symptoms of withdrawal because your body is used to nicotine. You may crave cigarettes, be irritable, feel very hungry, cough often, get headaches, or have difficulty concentrating. The withdrawal symptoms are only temporary. They are strongest when you first quit, but they will go away within 10-14 days. To reduce the chances of relapse, try to:  Avoid drinking alcohol. Drinking lowers your chances of successfully quitting.  Reduce the amount of caffeine you consume. Once you quit smoking, the amount of caffeine in your body increases and can give you symptoms, such as a rapid heartbeat, sweating, and anxiety.  Avoid smokers because they can make you want to smoke.  Do not let weight gain distract you. Many smokers will gain weight when they quit, usually less than 10 pounds. Eat a healthy diet and stay active. You can always lose the weight gained after you quit.  Find ways to improve your mood other than smoking. FOR MORE INFORMATION  www.smokefree.gov  Document Released: 03/10/2001 Document Revised: 07/31/2013 Document Reviewed: 06/25/2011 [facility] Patient Information 2015 [place], [place]. This information is not intended to replace advice given to you by your health care provider. Make sure you discuss any questions you have with your health care provider.

## 2014-08-22 NOTE — Assessment & Plan Note (Signed)
No recent episodes of SVT. Recent palpitations. If symptoms get worse, recommended we order a Holter monitor

## 2014-08-22 NOTE — Assessment & Plan Note (Signed)
We have encouraged him to continue to work on weaning his cigarettes and smoking cessation. He will continue to work on this and does not want any assistance with chantix.  

## 2014-08-29 ENCOUNTER — Telehealth: Payer: Self-pay

## 2014-08-29 NOTE — Telephone Encounter (Signed)
Pt wife called, states pt needs to r/s stress test scheduled for Friday 6/3, due to his cough. Please call

## 2014-08-29 NOTE — Telephone Encounter (Signed)
S/w pt who indicates he is still coughing when lying flat and would like to reschedule myoview. Agreeable to reschedule 6/15

## 2014-08-30 ENCOUNTER — Telehealth: Payer: Self-pay

## 2014-08-30 NOTE — Telephone Encounter (Signed)
S/w pt to inform him that myoview rescheduled for 6/15 at 8:00am. Pt to arrive at 7:45. Pt verbalized understanding with no further questions.

## 2014-08-31 ENCOUNTER — Ambulatory Visit: Payer: No Typology Code available for payment source

## 2014-09-12 ENCOUNTER — Ambulatory Visit
Admission: RE | Admit: 2014-09-12 | Discharge: 2014-09-12 | Disposition: A | Discharge status: Home / Self Care | Payer: No Typology Code available for payment source | Source: Ambulatory Visit | Attending: Cardiovascular Disease | Admitting: Cardiovascular Disease

## 2014-09-12 DIAGNOSIS — R079 Chest pain, unspecified: Secondary | ICD-10-CM | POA: Insufficient documentation

## 2014-09-12 DIAGNOSIS — I4891 Unspecified atrial fibrillation: Secondary | ICD-10-CM | POA: Insufficient documentation

## 2014-09-12 HISTORY — DX: Essential (primary) hypertension: I10

## 2014-09-12 LAB — NM MYOCAR MULTI W/SPECT W/WALL MOTION / EF
CHL CUP RESTING HR STRESS: 68 {beats}/min
LV sys vol: 21 mL
LVDIAVOL: 76 mL
NUC STRESS TID: 0.94
Peak HR: 139 {beats}/min
Percent HR: 78 %
SDS: 0
SRS: 0
SSS: 0

## 2014-09-12 MED ORDER — TECHNETIUM TC 99M SESTAMIBI - CARDIOLITE
13.0000 | Freq: Once | INTRAVENOUS | Status: AC | PRN
  Administered 2014-09-12: 08:00:00 14.141 via INTRAVENOUS

## 2014-09-12 MED ORDER — TECHNETIUM TC 99M SESTAMIBI - CARDIOLITE
33.0000 | Freq: Once | INTRAVENOUS | Status: AC | PRN
  Administered 2014-09-12: 31.55 via INTRAVENOUS

## 2014-12-02 ENCOUNTER — Encounter (HOSPITAL_COMMUNITY): Payer: Self-pay

## 2014-12-02 ENCOUNTER — Emergency Department (HOSPITAL_COMMUNITY)
Admission: EM | Admit: 2014-12-02 | Discharge: 2014-12-03 | Disposition: A | Discharge status: Home / Self Care | Payer: No Typology Code available for payment source | Attending: Emergency Medicine | Admitting: Emergency Medicine

## 2014-12-02 DIAGNOSIS — Z7982 Long term (current) use of aspirin: Secondary | ICD-10-CM | POA: Diagnosis not present

## 2014-12-02 DIAGNOSIS — Z720 Tobacco use: Secondary | ICD-10-CM | POA: Insufficient documentation

## 2014-12-02 DIAGNOSIS — R55 Syncope and collapse: Secondary | ICD-10-CM | POA: Insufficient documentation

## 2014-12-02 DIAGNOSIS — E785 Hyperlipidemia, unspecified: Secondary | ICD-10-CM | POA: Insufficient documentation

## 2014-12-02 DIAGNOSIS — Z87442 Personal history of urinary calculi: Secondary | ICD-10-CM | POA: Diagnosis not present

## 2014-12-02 DIAGNOSIS — I10 Essential (primary) hypertension: Secondary | ICD-10-CM | POA: Insufficient documentation

## 2014-12-02 DIAGNOSIS — I4891 Unspecified atrial fibrillation: Secondary | ICD-10-CM | POA: Diagnosis not present

## 2014-12-02 DIAGNOSIS — K219 Gastro-esophageal reflux disease without esophagitis: Secondary | ICD-10-CM | POA: Diagnosis not present

## 2014-12-02 DIAGNOSIS — Z79899 Other long term (current) drug therapy: Secondary | ICD-10-CM | POA: Diagnosis not present

## 2014-12-02 DIAGNOSIS — Z880 Allergy status to penicillin: Secondary | ICD-10-CM | POA: Diagnosis not present

## 2014-12-02 DIAGNOSIS — R002 Palpitations: Secondary | ICD-10-CM | POA: Diagnosis present

## 2014-12-02 LAB — BASIC METABOLIC PANEL
ANION GAP: 7 (ref 5–15)
BUN: 14 mg/dL (ref 6–20)
CALCIUM: 8.7 mg/dL — AB (ref 8.9–10.3)
CO2: 28 mmol/L (ref 22–32)
Chloride: 106 mmol/L (ref 101–111)
Creatinine, Ser: 0.76 mg/dL (ref 0.61–1.24)
GFR calc Af Amer: >60 mL/min (ref >60)
Glucose, Bld: 106 mg/dL — ABNORMAL HIGH (ref 65–99)
Potassium: 2.7 mmol/L — CL (ref 3.5–5.1)
Sodium: 141 mmol/L (ref 135–145)

## 2014-12-02 LAB — TROPONIN I: Troponin I: <0.03 ng/mL (ref <0.031)

## 2014-12-02 MED ORDER — DILTIAZEM HCL 60 MG PO TABS
60.0000 mg | ORAL_TABLET | ORAL | Status: AC
  Administered 2014-12-02: 60 mg via ORAL
  Filled 2014-12-02: qty 1

## 2014-12-02 NOTE — ED Notes (Signed)
Per EMS: Pt experiencing an irregular heart rate. EMS states their monitor showed SVT with a HR in the 180's-190's. EMS gave  of adenosine, no change. Then gave  adenosine, still no change. Finally gave  diltiazem which slowed rate to 110's. Pt rate is 120 upon arrival, and irregular. Pt denies pain at this time, just feels "like my hearts fluttering". BP 124/94, 100% RA.

## 2014-12-02 NOTE — ED Provider Notes (Signed)
CSN: [id number]     Arrival date & time 12/02/14  2009 History   First MD Initiated Contact with Patient 12/02/14 2014     Chief Complaint  Patient presents with  . Atrial Fibrillation     (Consider location/radiation/quality/duration/timing/severity/associated sxs/prior Treatment) Patient is a 44 y.o. male presenting with palpitations.  Palpitations Palpitations quality:  Irregular Onset quality:  Sudden Duration:  2 hours Timing:  Constant Progression:  Waxing and waning Chronicity:  Recurrent Context comment:  Quit taking his diltiazem a few days ago Relieved by: EMS gave diltiazem 10mg  IV. Worsened by:  Nothing Ineffective treatments: EMS tried adenosine without effect. Associated symptoms: chest pressure, dizziness and near-syncope   Associated symptoms: no chest pain and no shortness of breath     Past Medical History  Diagnosis Date  . GERD (gastroesophageal reflux disease)   . Abdominal pain   . A-fib   . Hyperlipidemia   . Arrhythmia   . Kidney stones   . Hypertension    Past Surgical History  Procedure Laterality Date  . Knee surgery      age 24   Family History  Problem Relation Age of Onset  . Heart disease Mother   . Heart attack Mother    Social History  Substance Use Topics  . Smoking status: Current Every Day Smoker -- 1.00 packs/day for 30 years  . Smokeless tobacco: Never Used  . Alcohol Use: Yes    Review of Systems  Respiratory: Negative for shortness of breath.   Cardiovascular: Positive for palpitations and near-syncope. Negative for chest pain.  Neurological: Positive for dizziness.  All other systems reviewed and are negative.     Allergies  Penicillins  Home Medications   Prior to Admission medications   Medication Sig Start Date End Date Taking? Authorizing Provider  aspirin 81 MG tablet Take 81 mg by mouth daily.    Historical Provider, MD  atorvastatin (LIPITOR) 40 MG tablet Take 40 mg by mouth every evening. 01/04/14    Historical Provider, MD  diltiazem (TIAZAC) 240 MG 24 hr capsule Take 240 mg by mouth daily. 01/04/14   Historical Provider, MD  Misc Natural Products (PROSTATE HEALTH) CAPS Take by mouth daily.    Historical Provider, MD  omeprazole (PRILOSEC) 20 MG capsule Take 20 mg by mouth daily. 01/04/14   Historical Provider, MD  POTASSIUM PO Take 1 tablet by mouth daily.    Historical Provider, MD   BP 127/84 mmHg  Pulse 110  Resp 17  SpO2 97% Physical Exam  Constitutional: He is oriented to person, place, and time. He appears well-developed and well-nourished. No distress.  HENT:  Head: Normocephalic and atraumatic.  Mouth/Throat: Oropharynx is clear and moist.  Eyes: Conjunctivae are normal. Pupils are equal, round, and reactive to light. No scleral icterus.  Neck: Neck supple.  Cardiovascular: Normal heart sounds and intact distal pulses.  An irregularly irregular rhythm present. Tachycardia present.   No murmur heard. Pulmonary/Chest: Effort normal and breath sounds normal. No stridor. No respiratory distress. He has no wheezes. He has no rales.  Abdominal: Soft. He exhibits no distension. There is no tenderness.  Musculoskeletal: Normal range of motion. He exhibits no edema.  Neurological: He is alert and oriented to person, place, and time.  Skin: Skin is warm and dry. No rash noted.  Psychiatric: He has a normal mood and affect. His behavior is normal.  Nursing note and vitals reviewed.   ED Course  Procedures (including critical care time) Labs  Review Labs Reviewed  CBC WITH DIFFERENTIAL/PLATELET - Abnormal; Notable for the following:    WBC 13.4 (*)    MCHC 36.7 (*)    Lymphs Abs 4.2 (*)    Monocytes Absolute 1.1 (*)    All other components within normal limits  BASIC METABOLIC PANEL - Abnormal; Notable for the following:    Potassium 2.7 (*)    Glucose, Bld 106 (*)    Calcium 8.7 (*)    All other components within normal limits  TROPONIN I  TSH    Imaging Review No  results found. I have personally reviewed and evaluated these images and lab results as part of my medical decision-making.   EKG Interpretation   Date/Time:  Sunday December 02 2014 20:38:11 EDT Ventricular Rate:  93 PR Interval:    QRS Duration: 85 QT Interval:  347 QTC Calculation: 432 R Axis:   -51 Text Interpretation:  Atrial fibrillation Ventricular premature complex  Left anterior fascicular block Abnormal R-wave progression, early  transition Left ventricular hypertrophy compared to prior, a fib has  replaced NSR Confirmed by [facility]  MD, TREY ([id number]) on 12/02/2014 10:00:52 PM      MDM   Final diagnoses:  Atrial fibrillation with RVR    44 yo male presenting with palpitations.  EMS reports that rhythm was SVT (don't have any rhythm strips to confirm this) which did not convert with adenosine but which converted to fast A fib with diltiazem.  Per last cardiology note, there was question whether he truly had a fib.  He is not currently anticoagulated.    12:57 AM spoke with Dr. Tresa Endo (cardiology) regarding Mr. Demond. Currently, he is well rate controlled, with good blood pressure, without chest pain. I suspect that his acute arrhythmia is in part due to missing doses of his diltiazem. Although his previous cardiac evaluation has been inconclusive about whether he is truly been in A. fib or not, this question is now answered. Thusly, Dr. Tresa Endo and I feel that he needs to be started on anticoagulation. I think it is reasonable to discharge him home with pelvic was, especially since he is well tolerated diltiazem in the past. He has a cardiologist and can follow-up closely. We'll also replace potassium. Return precautions given.  Blake Divine, MD 12/03/14 0111

## 2014-12-03 LAB — CBC WITH DIFFERENTIAL/PLATELET
Basophils Absolute: 0.1 10*3/uL (ref 0.0–0.1)
Basophils Relative: 0 % (ref 0–1)
EOS ABS: 0.5 10*3/uL (ref 0.0–0.7)
EOS PCT: 4 % (ref 0–5)
HCT: 42.8 % (ref 39.0–52.0)
Hemoglobin: 15.7 g/dL (ref 13.0–17.0)
LYMPHS ABS: 4.2 10*3/uL — AB (ref 0.7–4.0)
Lymphocytes Relative: 31 % (ref 12–46)
MCH: 31.6 pg (ref 26.0–34.0)
MCHC: 36.7 g/dL — ABNORMAL HIGH (ref 30.0–36.0)
MCV: 86.1 fL (ref 78.0–100.0)
MONO ABS: 1.1 10*3/uL — AB (ref 0.1–1.0)
Monocytes Relative: 8 % (ref 3–12)
Neutro Abs: 7.6 10*3/uL (ref 1.7–7.7)
Neutrophils Relative %: 57 % (ref 43–77)
PLATELETS: 244 10*3/uL (ref 150–400)
RBC: 4.97 MIL/uL (ref 4.22–5.81)
RDW: 13.5 % (ref 11.5–15.5)
WBC: 13.4 10*3/uL — AB (ref 4.0–10.5)

## 2014-12-03 MED ORDER — APIXABAN 5 MG PO TABS: 5.0000 mg | ORAL_TABLET | Freq: Two times a day (BID) | ORAL | Status: DC

## 2014-12-03 MED ORDER — APIXABAN 5 MG PO TABS
5.0000 mg | ORAL_TABLET | Freq: Two times a day (BID) | ORAL | Status: DC
  Administered 2014-12-03: 5 mg via ORAL
  Filled 2014-12-03: qty 1

## 2014-12-03 MED ORDER — POTASSIUM CHLORIDE CRYS ER 20 MEQ PO TBCR
40.0000 meq | EXTENDED_RELEASE_TABLET | Freq: Once | ORAL | Status: AC
Start: 2014-12-03 — End: 2014-12-03
  Administered 2014-12-03: 40 meq via ORAL
  Filled 2014-12-03: qty 2

## 2014-12-03 MED ORDER — POTASSIUM CHLORIDE CRYS ER 20 MEQ PO TBCR: 40.0000 meq | EXTENDED_RELEASE_TABLET | Freq: Once | ORAL | Status: DC

## 2014-12-03 NOTE — Discharge Instructions (Signed)

## 2015-01-21 ENCOUNTER — Encounter: Payer: Self-pay | Admitting: *Deleted

## 2015-01-21 ENCOUNTER — Ambulatory Visit: Payer: No Typology Code available for payment source | Admitting: Cardiovascular Disease

## 2016-01-24 ENCOUNTER — Ambulatory Visit: Payer: No Typology Code available for payment source | Admitting: Cardiovascular Disease

## 2016-01-24 ENCOUNTER — Encounter: Payer: Self-pay | Admitting: *Deleted

## 2016-03-11 ENCOUNTER — Telehealth: Payer: Self-pay | Admitting: Cardiovascular Disease

## 2016-03-11 NOTE — Telephone Encounter (Signed)
3 attempts to schedule fu from recall list.  No ans no vm.  Deleting recall.   °

## 2018-08-02 ENCOUNTER — Emergency Department: Payer: 59

## 2018-08-02 ENCOUNTER — Encounter: Payer: Self-pay | Admitting: Emergency Medicine

## 2018-08-02 ENCOUNTER — Emergency Department
Admission: EM | Admit: 2018-08-02 | Discharge: 2018-08-02 | Disposition: A | Discharge status: Home / Self Care | Payer: 59 | Attending: Emergency Medicine | Admitting: Emergency Medicine

## 2018-08-02 ENCOUNTER — Other Ambulatory Visit: Payer: Self-pay

## 2018-08-02 DIAGNOSIS — I10 Essential (primary) hypertension: Secondary | ICD-10-CM | POA: Insufficient documentation

## 2018-08-02 DIAGNOSIS — R008 Other abnormalities of heart beat: Secondary | ICD-10-CM | POA: Diagnosis present

## 2018-08-02 DIAGNOSIS — E876 Hypokalemia: Secondary | ICD-10-CM | POA: Diagnosis not present

## 2018-08-02 DIAGNOSIS — Z7982 Long term (current) use of aspirin: Secondary | ICD-10-CM | POA: Diagnosis not present

## 2018-08-02 DIAGNOSIS — I482 Chronic atrial fibrillation, unspecified: Secondary | ICD-10-CM | POA: Insufficient documentation

## 2018-08-02 DIAGNOSIS — F17200 Nicotine dependence, unspecified, uncomplicated: Secondary | ICD-10-CM | POA: Diagnosis not present

## 2018-08-02 DIAGNOSIS — Z79899 Other long term (current) drug therapy: Secondary | ICD-10-CM | POA: Diagnosis not present

## 2018-08-02 LAB — CBC WITH DIFFERENTIAL/PLATELET
Abs Immature Granulocytes: 0.11 10*3/uL — ABNORMAL HIGH (ref 0.00–0.07)
Basophils Absolute: 0.1 10*3/uL (ref 0.0–0.1)
Basophils Relative: 0 %
Eosinophils Absolute: 0.4 10*3/uL (ref 0.0–0.5)
Eosinophils Relative: 3 %
HCT: 47.4 % (ref 39.0–52.0)
Hemoglobin: 16.9 g/dL (ref 13.0–17.0)
Immature Granulocytes: 1 %
Lymphocytes Relative: 22 %
Lymphs Abs: 3 10*3/uL (ref 0.7–4.0)
MCH: 30.7 pg (ref 26.0–34.0)
MCHC: 35.7 g/dL (ref 30.0–36.0)
MCV: 86 fL (ref 80.0–100.0)
Monocytes Absolute: 1.2 10*3/uL — ABNORMAL HIGH (ref 0.1–1.0)
Monocytes Relative: 8 %
Neutro Abs: 8.9 10*3/uL — ABNORMAL HIGH (ref 1.7–7.7)
Neutrophils Relative %: 66 %
Platelets: 258 10*3/uL (ref 150–400)
RBC: 5.51 MIL/uL (ref 4.22–5.81)
RDW: 13.2 % (ref 11.5–15.5)
WBC: 13.6 10*3/uL — ABNORMAL HIGH (ref 4.0–10.5)
nRBC: 0 % (ref 0.0–0.2)

## 2018-08-02 LAB — COMPREHENSIVE METABOLIC PANEL
ALT: 27 U/L (ref 0–44)
AST: 19 U/L (ref 15–41)
Albumin: 3.9 g/dL (ref 3.5–5.0)
Alkaline Phosphatase: 140 U/L — ABNORMAL HIGH (ref 38–126)
Anion gap: 10 (ref 5–15)
BUN: 21 mg/dL — ABNORMAL HIGH (ref 6–20)
CO2: 24 mmol/L (ref 22–32)
Calcium: 8.5 mg/dL — ABNORMAL LOW (ref 8.9–10.3)
Chloride: 104 mmol/L (ref 98–111)
Creatinine, Ser: 0.72 mg/dL (ref 0.61–1.24)
GFR calc Af Amer: >60 mL/min (ref >60)
GFR calc non Af Amer: >60 mL/min (ref >60)
Glucose, Bld: 105 mg/dL — ABNORMAL HIGH (ref 70–99)
Potassium: 2.8 mmol/L — ABNORMAL LOW (ref 3.5–5.1)
Sodium: 138 mmol/L (ref 135–145)
Total Bilirubin: 0.5 mg/dL (ref 0.3–1.2)
Total Protein: 6.9 g/dL (ref 6.5–8.1)

## 2018-08-02 LAB — TROPONIN I: Troponin I: <0.03 ng/mL (ref <0.03)

## 2018-08-02 MED ORDER — MAGNESIUM SULFATE 2 GM/50ML IV SOLN
2.0000 g | Freq: Once | INTRAVENOUS | Status: AC
  Administered 2018-08-02: 2 g via INTRAVENOUS
  Filled 2018-08-02: qty 50

## 2018-08-02 MED ORDER — APIXABAN 5 MG PO TABS: 5.0000 mg | ORAL_TABLET | Freq: Two times a day (BID) | ORAL | 1 refills | Status: DC

## 2018-08-02 MED ORDER — POTASSIUM CHLORIDE 10 MEQ/100ML IV SOLN
10.0000 meq | INTRAVENOUS | Status: AC
  Administered 2018-08-02 (×2): 10 meq via INTRAVENOUS
  Filled 2018-08-02 (×2): qty 100

## 2018-08-02 MED ORDER — POTASSIUM CHLORIDE CRYS ER 20 MEQ PO TBCR
40.0000 meq | EXTENDED_RELEASE_TABLET | Freq: Once | ORAL | Status: AC
  Administered 2018-08-02: 40 meq via ORAL
  Filled 2018-08-02: qty 2

## 2018-08-02 MED ORDER — APIXABAN 5 MG PO TABS
5.0000 mg | ORAL_TABLET | Freq: Two times a day (BID) | ORAL | Status: DC
  Administered 2018-08-02: 5 mg via ORAL
  Filled 2018-08-02: qty 1

## 2018-08-02 NOTE — Discharge Instructions (Signed)
Take Eliquis twice a day as prescribed.  Take your Cardizem every day as prescribed.  Follow-up with cardiology.  Return to the emergency room for chest pain, shortness of breath, palpitations, or dizziness.

## 2018-08-02 NOTE — Consult Note (Signed)
ANTICOAGULATION CONSULT NOTE - Initial Consult  Pharmacy Consult for Apixaban Dosing Indication: atrial fibrillation  Allergies  Allergen Reactions  . Penicillins Other (See Comments)    Unknown from childhood    Patient Measurements: Height: 6\' 1"  (185.4 cm) Weight: 190 lb (86.2 kg) IBW/kg (Calculated) : 79.9 Heparin Dosing Weight: 86.2 kg  Vital Signs: Temp: 97.8 F (36.6 C) (05/05 1201) Temp Source: Oral (05/05 1201) BP: 155/92 (05/05 1201) Pulse Rate: 90 (05/05 1201)  Labs: No results for input(s): HGB, HCT, PLT, APTT, LABPROT, INR, HEPARINUNFRC, HEPRLOWMOCWT, CREATININE, CKTOTAL, CKMB, TROPONINI in the last 72 hours.  CrCl cannot be calculated (Patient's most recent lab result is older than the maximum 21 days allowed.).   Medical History: Past Medical History:  Diagnosis Date  . A-fib (HCC)   . Abdominal pain   . Arrhythmia   . GERD (gastroesophageal reflux disease)   . Hyperlipidemia   . Hypertension   . Kidney stones     Medications:  (Not in a hospital admission)  Scheduled:  . apixaban  5 mg Oral BID   Infusions:  . potassium chloride     PRN:  Anti-infectives (From admission, onward)   None      Assessment: Pharmacy has been consulted to initiate Apixaban therapy for  48 y.o. Patient in the ED that has an irregular heartbeat and prior history of Nonvalvular Afib, currently taking aspirin 81 mg only. Patient has no previous history of using any type of anti-coagulant.   Goal of Therapy:  Monitor platelets by anticoagulation protocol: Yes   Plan:  Will order Apixaban 5mg  Q12 hours. We do not currently have a Scr, but the patient is less than 9 years old and weighs more than 60kg, so there is no need to start at a reduced dose.  Will monitor patient's renal function with AM labs and adjust dose accordingly  Bettey Costa, PharmD Clinical Pharmacist 08/02/2018 2:20 PM

## 2018-08-02 NOTE — ED Provider Notes (Signed)
[facility] Emergency Department Provider Note  ____________________________________________   None    (approximate)  I have reviewed the triage vital signs and the nursing notes.   HISTORY  Chief Complaint Irregular Heart Beat    HPI Trevor Franklin is a 48 y.o. male presents emergency department complaining of irregular heart rate.  States it started this morning.  He states he keeps going in and out of normal rhythm.  He has a history of A. fib.  He takes aspirin daily.  No other blood thinners.  He states that he gets a little shortness of breath with exertion and with positional changes.  Some chest discomfort but not really pain.  No fever, chills, body aches, vomiting or diarrhea.    Past Medical History:  Diagnosis Date  . A-fib (HCC)   . Abdominal pain   . Arrhythmia   . GERD (gastroesophageal reflux disease)   . Hyperlipidemia   . Hypertension   . Kidney stones     Patient Active Problem List   Diagnosis Date Noted  . Atrial fibrillation, unspecified 08/22/2014  . Pain in the chest 08/22/2014  . Smoker 08/22/2014  . SVT (supraventricular tachycardia) (HCC) 08/22/2014  . Hyperlipidemia 08/22/2014  . AP (abdominal pain) 03/17/2014    Past Surgical History:  Procedure Laterality Date  . KNEE SURGERY     age 89    Prior to Admission medications   Medication Sig Start Date End Date Taking? Authorizing Provider  aspirin 81 MG tablet Take 81 mg by mouth every other day.    Yes [provider]  atorvastatin (LIPITOR) 40 MG tablet Take 40 mg by mouth every evening. 01/04/14  Yes [provider]  diltiazem (TIAZAC) 240 MG 24 hr capsule Take 240 mg by mouth daily. 01/04/14  Yes [provider]  omeprazole (PRILOSEC) 20 MG capsule Take 20 mg by mouth daily. 01/04/14  Yes [provider]  POTASSIUM PO Take 595 mg by mouth daily.    Yes [provider]  cyclobenzaprine (FLEXERIL) 10 MG tablet Take 10  mg by mouth 3 (three) times daily. 07/26/18   [provider]  lisinopril (ZESTRIL) 10 MG tablet Take 5 mg by mouth daily. 06/28/18   [provider]    Allergies Penicillins  Family History  Problem Relation Age of Onset  . Heart disease Mother   . Heart attack Mother     Social History Social History   Tobacco Use  . Smoking status: Current Every Day Smoker    Packs/day: 1.00    Years: 30.00    Pack years: 30.00  . Smokeless tobacco: Never Used  Substance Use Topics  . Alcohol use: Yes  . Drug use: No    Review of Systems  Constitutional: No fever/chills Eyes: No visual changes. ENT: No sore throat. Respiratory: Denies cough Cardiovascular: Positive for irregular heart rate, shortness of breath related to the irregular heart rate Genitourinary: Negative for dysuria. Musculoskeletal: Negative for back pain. Skin: Negative for rash.    ____________________________________________   PHYSICAL EXAM:  VITAL SIGNS: ED Triage Vitals [08/02/18 1201]  Enc Vitals Group     BP (!) 155/92     Pulse Rate 90     Resp 16     Temp 97.8 F (36.6 C)     Temp Source Oral     SpO2 98 %     Weight 190 lb (86.2 kg)     Height 6\' 1"  (1.854 m)  Head Circumference      Peak Flow      Pain Score 0     Pain Loc      Pain Edu?      Excl. in GC?     Constitutional: Alert and oriented. Well appearing and in no acute distress. Eyes: Conjunctivae are normal.  Head: Atraumatic. Nose: No congestion/rhinnorhea. Mouth/Throat: Mucous membranes are moist.   Neck:  supple no lymphadenopathy noted Cardiovascular: Normal rate, regular rhythm. Heart sounds are normal, no A. fib noted while I was in the exam room. Respiratory: Normal respiratory effort.  No retractions, lungs c t a  Abd: soft nontender bs normal all 4 quad GU: deferred Musculoskeletal: FROM all extremities, warm and well perfused Neurologic:  Normal speech and language.  Skin:  Skin is warm, dry  and intact. No rash noted. Psychiatric: Mood and affect are normal. Speech and behavior are normal.  ____________________________________________   LABS (all labs ordered are listed, but only abnormal results are displayed)  Labs Reviewed  COMPREHENSIVE METABOLIC PANEL - Abnormal; Notable for the following components:      Result Value   Potassium 2.8 (*)    Glucose, Bld 105 (*)    BUN 21 (*)    Calcium 8.5 (*)    Alkaline Phosphatase 140 (*)    All other components within normal limits  CBC WITH DIFFERENTIAL/PLATELET - Abnormal; Notable for the following components:   WBC 13.6 (*)    Neutro Abs 8.9 (*)    Monocytes Absolute 1.2 (*)    Abs Immature Granulocytes 0.11 (*)    All other components within normal limits  TROPONIN I   ____________________________________________   ____________________________________________  RADIOLOGY  Chest x-ray is normal  ____________________________________________   PROCEDURES  Procedure(s) performed: EKG normal sinus rhythm  Procedures    ____________________________________________   INITIAL IMPRESSION / ASSESSMENT AND PLAN / ED COURSE  Pertinent labs & imaging results that were available during my care of the patient were reviewed by me and considered in my medical decision making (see chart for details).   Patient is 48 year old male presents emergency department with irregular heart rate.  States he feels himself going in and out of A. fib.  Some shortness of breath associated.  Some chest discomfort but not chest pain.  No fever chills, cough, vomiting or diarrhea.  Physical exam patient appears well.  He is nervous.  EKG does not show A. fib.  Monitor while in the exam room shows 1 run of A. fib.  However heart sounds appear to be normal when I listen to him.  And lungs are clear to all station.  Labs, cardiac monitoring, chest x-ray ordered  Discussed case with Dr. Don Perking  Clinical Course as of Aug 01 1649  Tue  Aug 02, 2018  1648 APTT [KP]    Clinical Course User Index [KP] Minna Antis, MD  Patient's been given IV potassium along with oral potassium.  IV magnesium.  He will be started on Eliquis here in the ED due to the A. fib.  Prescription for Eliquis will be given with referral to cardiology.  As part of my medical decision making, I reviewed the following data within the electronic MEDICAL RECORD NUMBER Nursing notes reviewed and incorporated, Labs reviewed troponin is normal, comprehensive metabolic panel shows potassium of 2.8, CBC shows elevated WBC at 13.6, EKG interpreted NSR, Old chart reviewed, Radiograph reviewed chest x-ray is normal, Evaluated by EM attending dr Don Perking, Notes from prior ED visits  and [place] Controlled Substance Database  ____________________________________________   FINAL CLINICAL IMPRESSION(S) / ED DIAGNOSES  Final diagnoses:  Hypokalemia  Chronic atrial fibrillation      NEW MEDICATIONS STARTED DURING THIS VISIT:  New Prescriptions   No medications on file     Note:  This document was prepared using Dragon voice recognition software and may include unintentional dictation errors.    Faythe Ghee, PA-C 08/02/18 1651    Don Perking, [place], MD 08/04/18 [phone]

## 2018-08-02 NOTE — ED Triage Notes (Signed)
Pt states he started feeling irregular heart beat around 0600 this am, and it "keeps going in and out of normal beats." Is having shob and dizziness, has a hx of afib, NAD. Denies cp.

## 2018-08-02 NOTE — ED Notes (Signed)
MD veronese notified of potassium 2.8 orders for potassium.

## 2019-06-17 ENCOUNTER — Emergency Department
Admission: EM | Admit: 2019-06-17 | Discharge: 2019-06-18 | Disposition: A | Discharge status: Home / Self Care | Payer: No Typology Code available for payment source | Attending: Emergency Medicine | Admitting: Emergency Medicine

## 2019-06-17 ENCOUNTER — Emergency Department: Payer: No Typology Code available for payment source

## 2019-06-17 ENCOUNTER — Other Ambulatory Visit: Payer: Self-pay

## 2019-06-17 DIAGNOSIS — Z7901 Long term (current) use of anticoagulants: Secondary | ICD-10-CM | POA: Diagnosis not present

## 2019-06-17 DIAGNOSIS — Z79899 Other long term (current) drug therapy: Secondary | ICD-10-CM | POA: Diagnosis not present

## 2019-06-17 DIAGNOSIS — F17210 Nicotine dependence, cigarettes, uncomplicated: Secondary | ICD-10-CM | POA: Insufficient documentation

## 2019-06-17 DIAGNOSIS — R079 Chest pain, unspecified: Secondary | ICD-10-CM

## 2019-06-17 DIAGNOSIS — R0789 Other chest pain: Secondary | ICD-10-CM | POA: Diagnosis not present

## 2019-06-17 DIAGNOSIS — I10 Essential (primary) hypertension: Secondary | ICD-10-CM | POA: Insufficient documentation

## 2019-06-17 LAB — CBC
HCT: 46.9 % (ref 39.0–52.0)
Hemoglobin: 16.3 g/dL (ref 13.0–17.0)
MCH: 30.3 pg (ref 26.0–34.0)
MCHC: 34.8 g/dL (ref 30.0–36.0)
MCV: 87.2 fL (ref 80.0–100.0)
Platelets: 239 10*3/uL (ref 150–400)
RBC: 5.38 MIL/uL (ref 4.22–5.81)
RDW: 12.8 % (ref 11.5–15.5)
WBC: 10.8 10*3/uL — ABNORMAL HIGH (ref 4.0–10.5)
nRBC: 0 % (ref 0.0–0.2)

## 2019-06-17 LAB — BASIC METABOLIC PANEL
Anion gap: 7 (ref 5–15)
BUN: 21 mg/dL — ABNORMAL HIGH (ref 6–20)
CO2: 26 mmol/L (ref 22–32)
Calcium: 9.1 mg/dL (ref 8.9–10.3)
Chloride: 105 mmol/L (ref 98–111)
Creatinine, Ser: 0.74 mg/dL (ref 0.61–1.24)
GFR calc Af Amer: >60 mL/min (ref >60)
GFR calc non Af Amer: >60 mL/min (ref >60)
Glucose, Bld: 163 mg/dL — ABNORMAL HIGH (ref 70–99)
Potassium: 3.4 mmol/L — ABNORMAL LOW (ref 3.5–5.1)
Sodium: 138 mmol/L (ref 135–145)

## 2019-06-17 LAB — TROPONIN I (HIGH SENSITIVITY): Troponin I (High Sensitivity): 3 ng/L (ref <18)

## 2019-06-17 MED ORDER — SODIUM CHLORIDE 0.9% FLUSH
3.0000 mL | Freq: Once | INTRAVENOUS | Status: AC
  Administered 2019-06-17: 3 mL via INTRAVENOUS

## 2019-06-17 MED ORDER — IOHEXOL 350 MG/ML SOLN
75.0000 mL | Freq: Once | INTRAVENOUS | Status: AC | PRN
  Administered 2019-06-17: 75 mL via INTRAVENOUS

## 2019-06-17 NOTE — ED Provider Notes (Signed)
[facility] Emergency Department Provider Note  ____________________________________________   First MD Initiated Contact with Patient 06/17/19 2307     (approximate)  I have reviewed the triage vital signs and the nursing notes.   HISTORY  Chief Complaint Chest Pain    HPI Trevor Franklin is a 49 y.o. male with below list of previous medical conditions including atrial fibrillation for which patient takes Eliquis and is compliant presents to the emergency department secondary to central chest pain described as pressure that radiates to the neck and left arm which patient states began this morning and has been persistent during the course of the day with periods of increased intensity.  Of note patient has a positive familial history of myocardial infarction in his mother who died at the age of 2 from an MI.        Past Medical History:  Diagnosis Date  . A-fib ([place])   . Abdominal pain   . Arrhythmia   . GERD (gastroesophageal reflux disease)   . Hyperlipidemia   . Hypertension   . Kidney stones     Patient Active Problem List   Diagnosis Date Noted  . Atrial fibrillation, unspecified 08/22/2014  . Pain in the chest 08/22/2014  . Smoker 08/22/2014  . SVT (supraventricular tachycardia) ([place]) 08/22/2014  . Hyperlipidemia 08/22/2014  . AP (abdominal pain) 03/17/2014    Past Surgical History:  Procedure Laterality Date  . KNEE SURGERY     age 69    Prior to Admission medications   Medication Sig Start Date End Date Taking? Authorizing Provider  apixaban (ELIQUIS) 5 MG TABS tablet Take 1 tablet (5 mg total) by mouth 2 (two) times daily. 08/02/18   Rudene Re, MD  atorvastatin (LIPITOR) 40 MG tablet Take 40 mg by mouth every evening. 01/04/14   [provider]  cyclobenzaprine (FLEXERIL) 10 MG tablet Take 10 mg by mouth 3 (three) times daily. 07/26/18   [provider]  diltiazem (TIAZAC) 240 MG 24 hr capsule Take 240 mg  by mouth daily. 01/04/14   [provider]  lisinopril (ZESTRIL) 10 MG tablet Take 5 mg by mouth daily. 06/28/18   [provider]  omeprazole (PRILOSEC) 20 MG capsule Take 20 mg by mouth daily. 01/04/14   [provider]  POTASSIUM PO Take 595 mg by mouth daily.     [provider]    Allergies Penicillins  Family History  Problem Relation Age of Onset  . Heart disease Mother   . Heart attack Mother     Social History Social History   Tobacco Use  . Smoking status: Current Every Day Smoker    Packs/day: 1.00    Years: 30.00    Pack years: 30.00  . Smokeless tobacco: Never Used  Substance Use Topics  . Alcohol use: Yes    Comment: social  . Drug use: No    Review of Systems Constitutional: No fever/chills Eyes: No visual changes. ENT: No sore throat. Cardiovascular: Positive for chest pain. Respiratory: Denies shortness of breath. Gastrointestinal: No abdominal pain.  No nausea, no vomiting.  No diarrhea.  No constipation. Genitourinary: Negative for dysuria. Musculoskeletal: Negative for neck pain.  Negative for back pain. Integumentary: Negative for rash. Neurological: Negative for headaches, focal weakness or numbness. __________________   PHYSICAL EXAM:  VITAL SIGNS: ED Triage Vitals [06/17/19 2048]  Enc Vitals Group     BP      Pulse      Resp  Temp      Temp src      SpO2      Weight 93 kg (205 lb)     Height 1.854 m (6\' 1" )     Head Circumference      Peak Flow      Pain Score 5     Pain Loc      Pain Edu?      Excl. in GC?     Constitutional: Alert and oriented.  Eyes: Conjunctivae are normal.  Mouth/Throat: Patient is wearing a mask. Neck: No stridor.  No meningeal signs.   Cardiovascular: Normal rate, regular rhythm. Good peripheral circulation. Grossly normal heart sounds. Respiratory: Normal respiratory effort.  No retractions. Gastrointestinal: Soft and nontender. No distention.  Musculoskeletal:  No lower extremity tenderness nor edema. No gross deformities of extremities. Neurologic:  Normal speech and language. No gross focal neurologic deficits are appreciated.  Skin:  Skin is warm, dry and intact. Psychiatric: Mood and affect are normal. Speech and behavior are normal.  ____________________________________________   LABS (all labs ordered are listed, but only abnormal results are displayed)  Labs Reviewed  BASIC METABOLIC PANEL - Abnormal; Notable for the following components:      Result Value   Potassium 3.4 (*)    Glucose, Bld 163 (*)    BUN 21 (*)    All other components within normal limits  CBC - Abnormal; Notable for the following components:   WBC 10.8 (*)    All other components within normal limits  TROPONIN I (HIGH SENSITIVITY)  TROPONIN I (HIGH SENSITIVITY)  TROPONIN I (HIGH SENSITIVITY)   ____________________________________________  EKG  ED ECG REPORT I, [place] N Alfreida Steffenhagen, the attending physician, personally viewed and interpreted this ECG.   Date: 06/17/2019  EKG Time: 8:47 PM  Rate: 75  Rhythm: Normal sinus rhythm  Axis: Normal  Intervals: Normal  ST&T Change: None  ____________________________________________  RADIOLOGY I, [place] N Esra Frankowski, personally viewed and evaluated these images (plain radiographs) as part of my medical decision making, as well as reviewing the written report by the radiologist.  ED MD interpretation:   No acute findings noted on chest x-ray no evidence of pulmonary emboli noted on CT chest  Official radiology report(s): DG Chest 2 View  Result Date: 06/17/2019 CLINICAL DATA:  Chest pain EXAM: CHEST - 2 VIEW COMPARISON:  08/02/2018 FINDINGS: Heart is normal size. Linear scarring at the left base. Right lung clear. No effusions. No acute bony abnormality. IMPRESSION: No acute cardiopulmonary disease. Electronically Signed   By: 10/02/2018 M.D.   On: 06/17/2019 21:16   CT Angio Chest PE W and/or Wo  Contrast  Result Date: 06/17/2019 CLINICAL DATA:  Mid chest pain radiating to the left upper extremity EXAM: CT ANGIOGRAPHY CHEST WITH CONTRAST TECHNIQUE: Multidetector CT imaging of the chest was performed using the standard protocol during bolus administration of intravenous contrast. Multiplanar CT image reconstructions and MIPs were obtained to evaluate the vascular anatomy. CONTRAST:  45mL OMNIPAQUE IOHEXOL 350 MG/ML SOLN COMPARISON:  Radiograph 06/17/2019 FINDINGS: Cardiovascular: Satisfactory opacification the pulmonary arteries to the segmental level. No pulmonary artery filling defects are identified. Central pulmonary arteries are normal caliber. Cardiac size is upper limits normal with mild right atrial enlargement. Reflux of contrast into the IVC. The aorta is normal caliber. Normal 3 vessel branching of the aortic arch. Proximal great vessels are unremarkable. Mediastinum/Nodes: No mediastinal fluid or gas. Normal thyroid gland and thoracic inlet. No acute abnormality of the trachea  or esophagus. No worrisome mediastinal, hilar or axillary adenopathy. Lungs/Pleura: There are biapical regions of pleuroparenchymal scarring. Bandlike areas of opacity in the lung bases likely reflect subsegmental atelectasis. More dependent atelectasis posteriorly. No consolidation, features of edema, pneumothorax, or effusion. No suspicious pulmonary nodules or masses. Upper Abdomen: No acute abnormalities present in the visualized portions of the upper abdomen. Musculoskeletal: Multilevel degenerative changes are present in the imaged portions of the spine. No acute osseous abnormality or suspicious osseous lesion. Review of the MIP images confirms the above findings. IMPRESSION: 1. No evidence of pulmonary artery emboli. 2. Mild right atrial enlargement with reflux of contrast into the IVC, suggestive of elevated right heart pressures. 3. Biapical pleuroparenchymal scarring, no acute intrathoracic process.  Electronically Signed   By: Kreg Shropshire M.D.   On: 06/17/2019 23:58      Procedures   ____________________________________________   INITIAL IMPRESSION / MDM / ASSESSMENT AND PLAN / ED COURSE  As part of my medical decision making, I reviewed the following data within the electronic MEDICAL RECORD NUMBER   50 year old male presented with above-stated history and physical exam a differential diagnosis including but not limited to ACS including angina, pulmonary emboli aortic dissection versus other potential intrathoracic pathology.  EKG revealed no evidence of ischemia or infarction laboratory data including high-sensitivity troponin negative x3.  CT chest revealed no evidence of pulmonary emboli or aortic aneurysm.  Spoke with the patient at length regarding importance of following up with Dr. Darrold Junker cardiology for further outpatient evaluation.  ____________________________________________  FINAL CLINICAL IMPRESSION(S) / ED DIAGNOSES  Final diagnoses:  Chest pain, unspecified type     MEDICATIONS GIVEN DURING THIS VISIT:  Medications  sodium chloride flush (NS) 0.9 % injection 3 mL (3 mLs Intravenous Given 06/17/19 2325)  iohexol (OMNIPAQUE) 350 MG/ML injection 75 mL (75 mLs Intravenous Contrast Given 06/17/19 2344)     ED Discharge Orders    None      *Please note:  Trevor Franklin was evaluated in Emergency Department on 06/18/2019 for the symptoms described in the history of present illness. He was evaluated in the context of the global COVID-19 pandemic, which necessitated consideration that the patient might be at risk for infection with the SARS-CoV-2 virus that causes COVID-19. Institutional protocols and algorithms that pertain to the evaluation of patients at risk for COVID-19 are in a state of rapid change based on information released by regulatory bodies including the CDC and federal and state organizations. These policies and algorithms were followed during the  patient's care in the ED.  Some ED evaluations and interventions may be delayed as a result of limited staffing during the pandemic.*  Note:  This document was prepared using Dragon voice recognition software and may include unintentional dictation errors.   Darci Current, MD 06/18/19 [phone]

## 2019-06-17 NOTE — ED Triage Notes (Signed)
Pt to the er for midsternal chest pain that has radiated to the left chest, left shoulder and left arm. Pt reports pain into the left neck. Pt denies any trauma. Pain started 4 hours. Pt denies n/v/sob or diaphoresis. Pt has a hx of afib and is compliant on his eliquis.

## 2019-06-18 LAB — TROPONIN I (HIGH SENSITIVITY)
Troponin I (High Sensitivity): 4 ng/L (ref <18)
Troponin I (High Sensitivity): 3 ng/L (ref <18)

## 2019-06-18 NOTE — ED Notes (Signed)
Pt lying in bed at this time with no complaints or concerns presented to this RN. Pt informed that he was pending d/c at this time

## 2019-09-20 ENCOUNTER — Emergency Department
Admission: EM | Admit: 2019-09-20 | Discharge: 2019-09-20 | Disposition: A | Discharge status: Home / Self Care | Payer: No Typology Code available for payment source | Attending: Student | Admitting: Student

## 2019-09-20 ENCOUNTER — Emergency Department: Payer: No Typology Code available for payment source

## 2019-09-20 ENCOUNTER — Other Ambulatory Visit: Payer: Self-pay

## 2019-09-20 ENCOUNTER — Encounter: Payer: Self-pay | Admitting: Emergency Medicine

## 2019-09-20 DIAGNOSIS — R0789 Other chest pain: Secondary | ICD-10-CM | POA: Diagnosis not present

## 2019-09-20 DIAGNOSIS — Z7901 Long term (current) use of anticoagulants: Secondary | ICD-10-CM | POA: Diagnosis not present

## 2019-09-20 DIAGNOSIS — F17210 Nicotine dependence, cigarettes, uncomplicated: Secondary | ICD-10-CM | POA: Diagnosis not present

## 2019-09-20 DIAGNOSIS — Z79899 Other long term (current) drug therapy: Secondary | ICD-10-CM | POA: Insufficient documentation

## 2019-09-20 DIAGNOSIS — I10 Essential (primary) hypertension: Secondary | ICD-10-CM | POA: Insufficient documentation

## 2019-09-20 DIAGNOSIS — R079 Chest pain, unspecified: Secondary | ICD-10-CM

## 2019-09-20 LAB — CBC WITH DIFFERENTIAL/PLATELET
Abs Immature Granulocytes: 0.27 10*3/uL — ABNORMAL HIGH (ref 0.00–0.07)
Basophils Absolute: 0.1 10*3/uL (ref 0.0–0.1)
Basophils Relative: 0 %
Eosinophils Absolute: 0.1 10*3/uL (ref 0.0–0.5)
Eosinophils Relative: 1 %
HCT: 51.7 % (ref 39.0–52.0)
Hemoglobin: 17.9 g/dL — ABNORMAL HIGH (ref 13.0–17.0)
Immature Granulocytes: 1 %
Lymphocytes Relative: 16 %
Lymphs Abs: 3.2 10*3/uL (ref 0.7–4.0)
MCH: 30.4 pg (ref 26.0–34.0)
MCHC: 34.6 g/dL (ref 30.0–36.0)
MCV: 87.8 fL (ref 80.0–100.0)
Monocytes Absolute: 1.3 10*3/uL — ABNORMAL HIGH (ref 0.1–1.0)
Monocytes Relative: 7 %
Neutro Abs: 14.5 10*3/uL — ABNORMAL HIGH (ref 1.7–7.7)
Neutrophils Relative %: 75 %
Platelets: 209 10*3/uL (ref 150–400)
RBC: 5.89 MIL/uL — ABNORMAL HIGH (ref 4.22–5.81)
RDW: 14.5 % (ref 11.5–15.5)
WBC: 19.4 10*3/uL — ABNORMAL HIGH (ref 4.0–10.5)
nRBC: 0 % (ref 0.0–0.2)

## 2019-09-20 LAB — BASIC METABOLIC PANEL
Anion gap: 10 (ref 5–15)
BUN: 28 mg/dL — ABNORMAL HIGH (ref 6–20)
CO2: 28 mmol/L (ref 22–32)
Calcium: 9.1 mg/dL (ref 8.9–10.3)
Chloride: 101 mmol/L (ref 98–111)
Creatinine, Ser: 1 mg/dL (ref 0.61–1.24)
GFR calc Af Amer: >60 mL/min (ref >60)
GFR calc non Af Amer: >60 mL/min (ref >60)
Glucose, Bld: 120 mg/dL — ABNORMAL HIGH (ref 70–99)
Potassium: 3.9 mmol/L (ref 3.5–5.1)
Sodium: 139 mmol/L (ref 135–145)

## 2019-09-20 LAB — TROPONIN I (HIGH SENSITIVITY)
Troponin I (High Sensitivity): 6 ng/L (ref <18)
Troponin I (High Sensitivity): 6 ng/L (ref <18)

## 2019-09-20 LAB — HEPATIC FUNCTION PANEL
ALT: 22 U/L (ref 0–44)
AST: 17 U/L (ref 15–41)
Albumin: 3.3 g/dL — ABNORMAL LOW (ref 3.5–5.0)
Alkaline Phosphatase: 86 U/L (ref 38–126)
Bilirubin, Direct: 0.1 mg/dL (ref 0.0–0.2)
Indirect Bilirubin: 0.6 mg/dL (ref 0.3–0.9)
Total Bilirubin: 0.7 mg/dL (ref 0.3–1.2)
Total Protein: 6.1 g/dL — ABNORMAL LOW (ref 6.5–8.1)

## 2019-09-20 LAB — LIPASE, BLOOD: Lipase: 22 U/L (ref 11–51)

## 2019-09-20 MED ORDER — ALUM & MAG HYDROXIDE-SIMETH 200-200-20 MG/5ML PO SUSP
30.0000 mL | Freq: Once | ORAL | Status: AC
  Administered 2019-09-20: 30 mL via ORAL
  Filled 2019-09-20: qty 30

## 2019-09-20 MED ORDER — SUCRALFATE 1 GM/10ML PO SUSP
1.0000 g | Freq: Four times a day (QID) | ORAL | 1 refills | Status: DC
Start: 2019-09-20 — End: 2024-01-07

## 2019-09-20 MED ORDER — LIDOCAINE VISCOUS HCL 2 % MT SOLN
15.0000 mL | Freq: Once | OROMUCOSAL | Status: AC
  Administered 2019-09-20: 15 mL via ORAL
  Filled 2019-09-20: qty 15

## 2019-09-20 NOTE — ED Notes (Signed)
Lab to add on tests to blood already in lab

## 2019-09-20 NOTE — ED Notes (Signed)
Computer on wheels not functional. E-signature not obtained. Patient indicated understanding of discharge instructions by asking appropriate questions.

## 2019-09-20 NOTE — Discharge Instructions (Addendum)
Thank you for letting us take care of you in the ER today.  At this time, we suspect your symptoms are related to irritation of your esophagus and stomach related to your steroids.  -Continue taking your Prilosec every day, you can increase to 40 mg for the next month to help as well -We have written you a new prescription for Carafate, take as needed for further symptoms -Follow up with your regular doctor to review your ER visit -Return to the ER as needed for any new or worsening symptoms

## 2019-09-20 NOTE — ED Provider Notes (Signed)
[facility] Emergency Department Provider Note  ____________________________________________   First MD Initiated Contact with Patient 09/20/19 [phone]     (approximate)  I have reviewed the triage vital signs and the nursing notes.  History  Chief Complaint Chest Pain    HPI Trevor Franklin is a 49 y.o. male past medical history as below, including AF (on anticoagulation), GERD, who presents to the emergency department for chest discomfort. Symptoms started this morning at 3 AM.  Pain is in the central/lower chest and epigastrium and radiates up into his throat.  Describes it as an aching.  Moderate in severity.  No associated shortness of breath.  Perhaps mild nausea.  History of atrial fibrillation, on Eliquis.  Of note, he was recently started on and is finishing up a course of steroids (10 or 12 day course) that he was prescribed for some swelling.   Past Medical Hx Past Medical History:  Diagnosis Date  . A-fib ([place])   . Abdominal pain   . Arrhythmia   . GERD (gastroesophageal reflux disease)   . Hyperlipidemia   . Hypertension   . Kidney stones     Problem List Patient Active Problem List   Diagnosis Date Noted  . Atrial fibrillation, unspecified 08/22/2014  . Pain in the chest 08/22/2014  . Smoker 08/22/2014  . SVT (supraventricular tachycardia) ([place]) 08/22/2014  . Hyperlipidemia 08/22/2014  . AP (abdominal pain) 03/17/2014    Past Surgical Hx Past Surgical History:  Procedure Laterality Date  . KNEE SURGERY     age 56    Medications Prior to Admission medications   Medication Sig Start Date End Date Taking? Authorizing Provider  apixaban (ELIQUIS) 5 MG TABS tablet Take 1 tablet (5 mg total) by mouth 2 (two) times daily. 08/02/18   Rudene Re, MD  atorvastatin (LIPITOR) 40 MG tablet Take 40 mg by mouth every evening. 01/04/14   [provider]  cyclobenzaprine (FLEXERIL) 10 MG tablet Take 10 mg by mouth 3 (three) times  daily. 07/26/18   [provider]  diltiazem (TIAZAC) 240 MG 24 hr capsule Take 240 mg by mouth daily. 01/04/14   [provider]  lisinopril (ZESTRIL) 10 MG tablet Take 5 mg by mouth daily. 06/28/18   [provider]  omeprazole (PRILOSEC) 20 MG capsule Take 20 mg by mouth daily. 01/04/14   [provider]  POTASSIUM PO Take 595 mg by mouth daily.     [provider]    Allergies Penicillins  Family Hx Family History  Problem Relation Age of Onset  . Heart disease Mother   . Heart attack Mother     Social Hx Social History   Tobacco Use  . Smoking status: Current Every Day Smoker    Packs/day: 1.00    Years: 30.00    Pack years: 30.00  . Smokeless tobacco: Never Used  Substance Use Topics  . Alcohol use: Yes    Comment: social  . Drug use: No     Review of Systems  Constitutional: Negative for fever. Negative for chills. Eyes: Negative for visual changes. ENT: Negative for sore throat. Cardiovascular: + for chest pain. Respiratory: Negative for shortness of breath. Gastrointestinal: Negative for nausea. Negative for vomiting.  Genitourinary: Negative for dysuria. Musculoskeletal: Negative for leg swelling. Skin: Negative for rash. Neurological: Negative for headaches.   Physical Exam  Vital Signs: ED Triage Vitals  Enc Vitals Group     BP 09/20/19 0745 136/81  Pulse Rate 09/20/19 0745 85     Resp 09/20/19 0745 18     Temp 09/20/19 0745 98.2 F (36.8 C)     Temp Source 09/20/19 0745 Oral     SpO2 09/20/19 0745 97 %     Weight 09/20/19 0740 205 lb (93 kg)     Height 09/20/19 0740 6\' 1"  (1.854 m)     Head Circumference --      Peak Flow --      Pain Score 09/20/19 0740 5     Pain Loc --      Pain Edu? --      Excl. in GC? --     Constitutional: Alert and oriented. Well appearing. NAD.  Head: Normocephalic. Atraumatic. Eyes: Conjunctivae clear. Sclera anicteric. Pupils equal and symmetric. Nose: No masses  or lesions. No congestion or rhinorrhea. Mouth/Throat: Wearing mask.  Neck: No stridor. Trachea midline.  Cardiovascular: Normal rate, regular rhythm. Extremities well perfused. Respiratory: Normal respiratory effort.  Lungs CTAB. Gastrointestinal: Soft. Non-distended. Non-tender.  Genitourinary: Deferred. Musculoskeletal: No lower extremity edema. No deformities. Neurologic:  Normal speech and language. No gross focal or lateralizing neurologic deficits are appreciated.  Skin: Skin is warm, dry and intact. No rash noted. Psychiatric: Mood and affect are appropriate for situation.  EKG  Personally reviewed and interpreted by myself.   Date: 09/20/19 Time: 0741 Rate: 89 Rhythm: sinus Axis: left Intervals: WNL PVC No STEMI    Radiology  Personally reviewed available imaging myself.   CXR - IMPRESSION:  No active cardiopulmonary disease.    Procedures  Procedure(s) performed (including critical care):  Procedures   Initial Impression / Assessment and Plan / MDM / ED Course  49 y.o. male who presents to the ED for chest pain, as above  Ddx: ACS, GERD, steroid related gastritis/esophagitis, pancreatitis.  Doubt PE as he is compliant on his anticoagulation.  Will plan for labs, imaging, symptom control and reassess  EKG without acute ischemia.  Troponin and delta troponin negative.  LFTs, lipase unremarkable.  Does have a leukocytosis on CBC, but suspect this is related to his ongoing steroid course. Doubt infectious etiology of his white count as his CXR is negative, no fever, cough or other infectious symptoms.  Feels improved after GI cocktail.  As such, feel patient is stable for discharge with outpatient follow-up.  Provide Rx for Carafate, information for GERD related diet, and advised outpatient follow-up.  Patient comfortable with plan.  Given return precautions.   _______________________________   As part of my medical decision making I have reviewed  available labs, radiology tests, reviewed old records/performed chart review.    Final Clinical Impression(s) / ED Diagnosis  Final diagnoses:  Chest pain in adult       Note:  This document was prepared using Dragon voice recognition software and may include unintentional dictation errors.   53., MD 09/20/19 [phone]

## 2019-09-20 NOTE — ED Triage Notes (Signed)
Pt woke up at 3 am with central chest pain.  Does not radiate. Always present but at times gets worse.  No SHOB. Is having nausea. VSS.  Hx of afib, on eliquis.  Unlabored.

## 2019-10-25 ENCOUNTER — Other Ambulatory Visit: Payer: Self-pay

## 2019-10-25 ENCOUNTER — Encounter: Payer: Self-pay | Admitting: Emergency Medicine

## 2019-10-25 ENCOUNTER — Emergency Department: Payer: No Typology Code available for payment source

## 2019-10-25 ENCOUNTER — Emergency Department
Admission: EM | Admit: 2019-10-25 | Discharge: 2019-10-25 | Disposition: A | Discharge status: Home / Self Care | Payer: No Typology Code available for payment source | Attending: Emergency Medicine | Admitting: Emergency Medicine

## 2019-10-25 DIAGNOSIS — N451 Epididymitis: Secondary | ICD-10-CM | POA: Diagnosis not present

## 2019-10-25 DIAGNOSIS — I4891 Unspecified atrial fibrillation: Secondary | ICD-10-CM | POA: Insufficient documentation

## 2019-10-25 DIAGNOSIS — F17200 Nicotine dependence, unspecified, uncomplicated: Secondary | ICD-10-CM | POA: Insufficient documentation

## 2019-10-25 DIAGNOSIS — N5082 Scrotal pain: Secondary | ICD-10-CM

## 2019-10-25 DIAGNOSIS — Z79899 Other long term (current) drug therapy: Secondary | ICD-10-CM | POA: Diagnosis not present

## 2019-10-25 DIAGNOSIS — I10 Essential (primary) hypertension: Secondary | ICD-10-CM | POA: Insufficient documentation

## 2019-10-25 DIAGNOSIS — Z7901 Long term (current) use of anticoagulants: Secondary | ICD-10-CM | POA: Diagnosis not present

## 2019-10-25 DIAGNOSIS — N50812 Left testicular pain: Secondary | ICD-10-CM

## 2019-10-25 MED ORDER — KETOROLAC TROMETHAMINE 60 MG/2ML IM SOLN
60.0000 mg | Freq: Once | INTRAMUSCULAR | Status: AC
  Administered 2019-10-25: 60 mg via INTRAMUSCULAR
  Filled 2019-10-25: qty 2

## 2019-10-25 MED ORDER — DOXYCYCLINE MONOHYDRATE 100 MG PO CAPS
100.0000 mg | ORAL_CAPSULE | Freq: Two times a day (BID) | ORAL | 0 refills | Status: DC
Start: 2019-10-25 — End: 2021-12-17

## 2019-10-25 MED ORDER — TRAMADOL HCL 50 MG PO TABS: 50.0000 mg | ORAL_TABLET | Freq: Four times a day (QID) | ORAL | 0 refills | Status: AC | PRN

## 2019-10-25 NOTE — ED Provider Notes (Signed)
[facility] Emergency Department Provider Note   ____________________________________________   First MD Initiated Contact with Patient 10/25/19 1444     (approximate)  I have reviewed the triage vital signs and the nursing notes.   HISTORY  Chief Complaint Testicle Pain    HPI Trevor Franklin is a 49 y.o. male patient presents with left testicle pain for 1 day.  Patient states there is mild edema.  Patient denies dysuria, patient denies fever or rectal pain.  Patient said there is no urethral discharge.  Patient states he is in a monogamous relationship.  Patient rates pain as a 10/10.  Patient described pain is "achy".  No palliative measures for complaint.  Patient takes Xarelto.         Past Medical History:  Diagnosis Date   A-fib [facility])    Abdominal pain    Arrhythmia    GERD (gastroesophageal reflux disease)    Hyperlipidemia    Hypertension    Kidney stones     Patient Active Problem List   Diagnosis Date Noted   Atrial fibrillation, unspecified 08/22/2014   Pain in the chest 08/22/2014   Smoker 08/22/2014   SVT (supraventricular tachycardia) ([facility]) 08/22/2014   Hyperlipidemia 08/22/2014   AP (abdominal pain) 03/17/2014    Past Surgical History:  Procedure Laterality Date   KNEE SURGERY     age 56    Prior to Admission medications   Medication Sig Start Date End Date Taking? Authorizing Provider  apixaban (ELIQUIS) 5 MG TABS tablet Take 1 tablet (5 mg total) by mouth 2 (two) times daily. 08/02/18   Nita Sickle, MD  atorvastatin (LIPITOR) 40 MG tablet Take 40 mg by mouth every evening. 01/04/14   [provider]  cyclobenzaprine (FLEXERIL) 10 MG tablet Take 10 mg by mouth 3 (three) times daily. 07/26/18   [provider]  diltiazem (TIAZAC) 240 MG 24 hr capsule Take 240 mg by mouth daily. 01/04/14   [provider]  doxycycline (MONODOX) 100 MG capsule Take 1 capsule (100 mg total) by  mouth 2 (two) times daily. 10/25/19   Joni Reining, PA-C  lisinopril (ZESTRIL) 10 MG tablet Take 5 mg by mouth daily. 06/28/18   [provider]  omeprazole (PRILOSEC) 20 MG capsule Take 20 mg by mouth daily. 01/04/14   [provider]  POTASSIUM PO Take 595 mg by mouth daily.     [provider]  sucralfate (CARAFATE) 1 GM/10ML suspension Take 10 mLs (1 g total) by mouth 4 (four) times daily. 09/20/19 09/19/20  Miguel Aschoff., MD  traMADol (ULTRAM) 50 MG tablet Take 1 tablet (50 mg total) by mouth every 6 (six) hours as needed for up to 5 days for moderate pain. 10/25/19 10/30/19  Joni Reining, PA-C    Allergies Penicillins  Family History  Problem Relation Age of Onset   Heart disease Mother    Heart attack Mother     Social History Social History   Tobacco Use   Smoking status: Current Every Day Smoker    Packs/day: 1.00    Years: 30.00    Pack years: 30.00   Smokeless tobacco: Never Used  Substance Use Topics   Alcohol use: Yes    Comment: social   Drug use: No    Review of Systems  Constitutional: No fever/chills Eyes: No visual changes. ENT: No sore throat. Cardiovascular: Denies chest pain.  History of A. fib. Respiratory: Denies shortness of breath. Gastrointestinal: No  abdominal pain.  No nausea, no vomiting.  No diarrhea.  No constipation. Genitourinary: Negative for dysuria.  Left testicle pain. Musculoskeletal: Negative for back pain. Skin: Negative for rash. Neurological: Negative for headaches, focal weakness or numbness. Endocrine:  Hyperlipidemia hypertension. Allergic/Immunilogical: Penicillin. ____________________________________________   PHYSICAL EXAM:  VITAL SIGNS: ED Triage Vitals  Enc Vitals Group     BP 10/25/19 1225 (!) 131/84     Pulse Rate 10/25/19 1225 74     Resp 10/25/19 1225 16     Temp 10/25/19 1225 98.7 F (37.1 C)     Temp Source 10/25/19 1225 Oral     SpO2 10/25/19 1225 98 %     Weight  10/25/19 1223 (!) 205 lb 0.4 oz (93 kg)     Height 10/25/19 1223 6\' 1"  (1.854 m)     Head Circumference --      Peak Flow --      Pain Score 10/25/19 1223 10     Pain Loc --      Pain Edu? --      Excl. in GC? --    Constitutional: Alert and oriented. Well appearing and in no acute distress. Hematological/Lymphatic/Immunilogical: No cervical lymphadenopathy. Cardiovascular: Normal rate, regular rhythm. Grossly normal heart sounds.  Good peripheral circulation. Respiratory: Normal respiratory effort.  No retractions. Lungs CTAB. Gastrointestinal: Soft and nontender. No distention. No abdominal bruits. No CVA tenderness. Genitourinary: Mild scrotum edema. Skin:  Skin is warm, dry and intact. No rash noted.  No erythema in the scrotum area. Psychiatric: Mood and affect are normal. Speech and behavior are normal.  ____________________________________________   LABS (all labs ordered are listed, but only abnormal results are displayed)  Labs Reviewed  URINALYSIS, COMPLETE (UACMP) WITH MICROSCOPIC   ____________________________________________  EKG   ____________________________________________  RADIOLOGY  ED MD interpretation:    Official radiology report(s): 10/27/19 SCROTUM W/DOPPLER  Result Date: 10/25/2019 CLINICAL DATA:  49 year old with left testicular pain.  No injury. EXAM: SCROTAL ULTRASOUND DOPPLER ULTRASOUND OF THE TESTICLES TECHNIQUE: Complete ultrasound examination of the testicles, epididymis, and other scrotal structures was performed. Color and spectral Doppler ultrasound were also utilized to evaluate blood flow to the testicles. COMPARISON:  None. FINDINGS: Right testicle Measurements: 3.9 x 2.4 x 3.0 cm. No intratesticular mass or microlithiasis visualized. 0.5 cm echogenic structure with posterior acoustic shadowing along the periphery of the right testicle. This is suggestive for an extratesticular calcification such as a scrotal pearl. Left testicle Measurements:  3.7 x 2.3 x 2.9 cm. Poorly defined hypoechoic area along the periphery of the left testicle without significant vascular flow in this area. This poorly defined area measures 1.8 x 0.8 x 1.9 cm. Increased vascularity in the left testicle compared to the right. Right epididymis:  Normal in size and appearance. Left epididymis: Normal in size and appearance. Slightly increased vascularity compared to the right. Hydrocele:  Small bilateral hydroceles. Varicocele:  None visualized. Pulsed Doppler interrogation of both testes demonstrates normal low resistance arterial and venous waveforms bilaterally. IMPRESSION: 1. Slightly increased vascularity in the left testicle and left epididymis compared to the right side. Findings are suggestive for left epididymo-orchitis. 2. Poorly defined hypoechoic area in the periphery of the left testicle without significant vascular flow. Based on the hyperemia in the left testicle, suspect this could represent a small focus of infection or even a developing abscess. However, neoplasm in the left testicle cannot be excluded and recommend urology consultation. 3. Right scrotal calcification. 4. Negative for testicular torsion. These results  were called by telephone at the time of interpretation on 10/25/2019 at 1:57 pm to provider Shaune Pollack , who verbally acknowledged these results. Electronically Signed   By: Richarda Overlie M.D.   On: 10/25/2019 13:55    ____________________________________________   PROCEDURES  Procedure(s) performed (including Critical Care):  Procedures   ____________________________________________   INITIAL IMPRESSION / ASSESSMENT AND PLAN / ED COURSE  As part of my medical decision making, I reviewed the following data within the electronic MEDICAL RECORD NUMBER      Patient presents with 1 day of left scrotum/testicle pain.  Discussed ultrasound findings with patient consistent with epididymitis.  Patient given discharge care instruction advised  take medication as directed.  Patient advised to follow urology if no improvement in 1 week.  Return to ED if condition worsens.    JASSIAH VIVIANO was evaluated in Emergency Department on 10/25/2019 for the symptoms described in the history of present illness. He was evaluated in the context of the global COVID-19 pandemic, which necessitated consideration that the patient might be at risk for infection with the SARS-CoV-2 virus that causes COVID-19. Institutional protocols and algorithms that pertain to the evaluation of patients at risk for COVID-19 are in a state of rapid change based on information released by regulatory bodies including the CDC and federal and state organizations. These policies and algorithms were followed during the patient's care in the ED.       ____________________________________________   FINAL CLINICAL IMPRESSION(S) / ED DIAGNOSES  Final diagnoses:  Pain in scrotum without trauma of scrotum  Epididymitis     ED Discharge Orders         Ordered    doxycycline (MONODOX) 100 MG capsule  2 times daily     Discontinue  Reprint     10/25/19 1451    traMADol (ULTRAM) 50 MG tablet  Every 6 hours PRN     Discontinue  Reprint     10/25/19 1451           Note:  This document was prepared using Dragon voice recognition software and may include unintentional dictation errors.    Joni Reining, PA-C 10/25/19 1456    Shaune Pollack, MD 10/26/19 0700

## 2019-10-25 NOTE — Discharge Instructions (Signed)
Follow discharge care instruction take medication as directed. °

## 2019-10-25 NOTE — ED Triage Notes (Signed)
Left testicular pain x 1 day..  Also c/o intermittent fluid filled blisters to body.  Has been taking prednisone.

## 2019-11-28 ENCOUNTER — Ambulatory Visit (INDEPENDENT_AMBULATORY_CARE_PROVIDER_SITE_OTHER): Payer: No Typology Code available for payment source | Admitting: Urology

## 2019-11-28 ENCOUNTER — Encounter: Payer: Self-pay | Admitting: Urology

## 2019-11-28 ENCOUNTER — Other Ambulatory Visit: Payer: Self-pay

## 2019-11-28 VITALS — BP 131/80 | HR 85 | Ht 73.0 in | Wt 190.0 lb

## 2019-11-28 DIAGNOSIS — N419 Inflammatory disease of prostate, unspecified: Secondary | ICD-10-CM

## 2019-11-28 DIAGNOSIS — R3911 Hesitancy of micturition: Secondary | ICD-10-CM | POA: Diagnosis not present

## 2019-11-28 DIAGNOSIS — N453 Epididymo-orchitis: Secondary | ICD-10-CM

## 2019-11-28 DIAGNOSIS — Z87438 Personal history of other diseases of male genital organs: Secondary | ICD-10-CM | POA: Diagnosis not present

## 2019-11-28 LAB — BLADDER SCAN AMB NON-IMAGING: Scan Result: 4

## 2019-11-28 MED ORDER — SULFAMETHOXAZOLE-TRIMETHOPRIM 800-160 MG PO TABS: 1.0000 | ORAL_TABLET | Freq: Every day | ORAL | 0 refills | Status: DC

## 2019-11-28 MED ORDER — TAMSULOSIN HCL 0.4 MG PO CAPS: 0.4000 mg | ORAL_CAPSULE | Freq: Every day | ORAL | 0 refills | Status: DC

## 2019-11-28 NOTE — Progress Notes (Signed)
11/28/2019 2:17 PM   Trevor Franklin 02/15/1971 [id number]  Referring provider: Jaclyn Shaggy, MD [address]   [place],  [place] [address]  Chief Complaint  Patient presents with  . Other    HPI: Trevor Franklin is a 49 y.o. male who presents for recent ED follow-up of epididymitis.   Presented to [facility] ED 10/25/2019 with a 1 day history of left hemiscrotal pain and swelling  Exam described as mild scrotal edema  Scrotal ultrasound performed showed increased vascularity of the left testis and epididymis suggestive of epididymo-orchitis and a poorly defined 1.8 cm hypoechoic area without vascular flow of uncertain significance  Was treated with a 10-day course of doxycycline and tramadol  No urinalysis was ordered  States scrotal pain and swelling resolved however 3 weeks ago developed bilateral mild scrotal discomfort, ejaculatory pain and low back and side pain  Also complains of urinary hesitancy, weak urinary stream and postvoid dribbling  Denies dysuria or gross hematuria  Prior history urinary tract calculi   PMH: Past Medical History:  Diagnosis Date  . A-fib ([facility])   . Abdominal pain   . Arrhythmia   . GERD (gastroesophageal reflux disease)   . Hyperlipidemia   . Hypertension   . Kidney stones     Surgical History: Past Surgical History:  Procedure Laterality Date  . KNEE SURGERY     age 72    Home Medications:  Allergies as of 11/28/2019      Reactions   Penicillins Other (See Comments)   Unknown from childhood      Medication List       Accurate as of November 28, 2019  2:17 PM. If you have any questions, ask your nurse or doctor.        apixaban 5 MG Tabs tablet Commonly known as: Eliquis Take 1 tablet (5 mg total) by mouth 2 (two) times daily.   atorvastatin 40 MG tablet Commonly known as: LIPITOR Take 40 mg by mouth every evening.   cyclobenzaprine 10 MG tablet Commonly known as: FLEXERIL Take 10 mg by mouth 3 (three) times  daily.   diltiazem 240 MG 24 hr capsule Commonly known as: TIAZAC Take 240 mg by mouth daily.   doxycycline 100 MG capsule Commonly known as: MONODOX Take 1 capsule (100 mg total) by mouth 2 (two) times daily.   lisinopril 10 MG tablet Commonly known as: ZESTRIL Take 5 mg by mouth daily.   omeprazole 20 MG capsule Commonly known as: PRILOSEC Take 20 mg by mouth daily.   POTASSIUM PO Take 595 mg by mouth daily.   sucralfate 1 GM/10ML suspension Commonly known as: Carafate Take 10 mLs (1 g total) by mouth 4 (four) times daily.       Allergies:  Allergies  Allergen Reactions  . Penicillins Other (See Comments)    Unknown from childhood    Family History: Family History  Problem Relation Age of Onset  . Heart disease Mother   . Heart attack Mother     Social History:  reports that he has been smoking. He has a 30.00 pack-year smoking history. He has never used smokeless tobacco. He reports current alcohol use. He reports that he does not use drugs.   Physical Exam: BP 131/80   Pulse 85   Ht 6\' 1"  (1.854 m)   Wt 190 lb (86.2 kg)   BMI 25.07 kg/m   Constitutional:  Alert and oriented, No acute distress. HEENT: [place] AT, moist  mucus membranes.  Trachea midline, no masses. Cardiovascular: No clubbing, cyanosis, or edema. Respiratory: Normal respiratory effort, no increased work of breathing. GI: Abdomen is soft, nontender, nondistended, no abdominal masses GU: Phallus without lesions.  Testes descended bilaterally without masses or tenderness, spermatic cord/epididymis palpably normal bilaterally.  Prostate 40 g, smooth without nodules Lymph: No cervical or inguinal lymphadenopathy. Skin: No rashes, bruises or suspicious lesions. Neurologic: Grossly intact, no focal deficits, moving all 4 extremities. Psychiatric: Normal mood and affect.  Laboratory Data:  Urinalysis Dipstick trace blood Microscopy negative  Assessment & Plan:    1.  Prostatitis  Present  symptoms consistent with prostatic inflammation  Since urinalysis was not checked with his initial diagnosis of epididymitis and he received antibiotics we will go ahead and treat with an additional 2 weeks and Rx Septra DS sent to pharmacy  Rx tamsulosin 0.4 mg  Follow-up 1 month symptom recheck and instructed to call earlier for worsening symptoms  PVR by bladder scan was 4 mL  2.  Epididymitis  Resolved  Schedule repeat scrotal ultrasound on follow-up   Riki Altes, MD  [facility] [address] [place], [place] [address] [phone]

## 2019-11-29 LAB — MICROSCOPIC EXAMINATION: Bacteria, UA: NONE SEEN

## 2019-11-29 LAB — URINALYSIS, COMPLETE
Bilirubin, UA: NEGATIVE
Glucose, UA: NEGATIVE
Ketones, UA: NEGATIVE
Leukocytes,UA: NEGATIVE
Nitrite, UA: NEGATIVE
Protein,UA: NEGATIVE
Specific Gravity, UA: >1.03 — ABNORMAL HIGH (ref 1.005–1.030)
Urobilinogen, Ur: 0.2 mg/dL (ref 0.2–1.0)
pH, UA: 5.5 (ref 5.0–7.5)

## 2019-12-20 ENCOUNTER — Other Ambulatory Visit: Payer: Self-pay | Admitting: Urology

## 2020-01-03 ENCOUNTER — Ambulatory Visit: Payer: No Typology Code available for payment source | Admitting: Urology

## 2020-01-04 ENCOUNTER — Encounter: Payer: Self-pay | Admitting: Urology

## 2020-01-25 ENCOUNTER — Other Ambulatory Visit: Payer: Self-pay | Admitting: Urology

## 2020-04-08 ENCOUNTER — Encounter: Payer: Self-pay | Admitting: Emergency Medicine

## 2020-04-08 ENCOUNTER — Emergency Department: Payer: No Typology Code available for payment source

## 2020-04-08 ENCOUNTER — Other Ambulatory Visit: Payer: Self-pay

## 2020-04-08 DIAGNOSIS — I10 Essential (primary) hypertension: Secondary | ICD-10-CM | POA: Diagnosis not present

## 2020-04-08 DIAGNOSIS — Z7901 Long term (current) use of anticoagulants: Secondary | ICD-10-CM | POA: Diagnosis not present

## 2020-04-08 DIAGNOSIS — F17200 Nicotine dependence, unspecified, uncomplicated: Secondary | ICD-10-CM | POA: Diagnosis not present

## 2020-04-08 DIAGNOSIS — Z79899 Other long term (current) drug therapy: Secondary | ICD-10-CM | POA: Insufficient documentation

## 2020-04-08 DIAGNOSIS — K219 Gastro-esophageal reflux disease without esophagitis: Secondary | ICD-10-CM | POA: Diagnosis not present

## 2020-04-08 DIAGNOSIS — R109 Unspecified abdominal pain: Secondary | ICD-10-CM | POA: Diagnosis present

## 2020-04-08 DIAGNOSIS — N200 Calculus of kidney: Secondary | ICD-10-CM | POA: Insufficient documentation

## 2020-04-08 LAB — CBC
HCT: 45.7 % (ref 39.0–52.0)
Hemoglobin: 16.1 g/dL (ref 13.0–17.0)
MCH: 30.6 pg (ref 26.0–34.0)
MCHC: 35.2 g/dL (ref 30.0–36.0)
MCV: 86.7 fL (ref 80.0–100.0)
Platelets: 265 10*3/uL (ref 150–400)
RBC: 5.27 MIL/uL (ref 4.22–5.81)
RDW: 12.6 % (ref 11.5–15.5)
WBC: 12.6 10*3/uL — ABNORMAL HIGH (ref 4.0–10.5)
nRBC: 0 % (ref 0.0–0.2)

## 2020-04-08 LAB — URINALYSIS, COMPLETE (UACMP) WITH MICROSCOPIC
Bacteria, UA: NONE SEEN
Bilirubin Urine: NEGATIVE
Glucose, UA: NEGATIVE mg/dL
Hgb urine dipstick: NEGATIVE
Ketones, ur: NEGATIVE mg/dL
Leukocytes,Ua: NEGATIVE
Nitrite: NEGATIVE
Protein, ur: NEGATIVE mg/dL
Specific Gravity, Urine: 1.02 (ref 1.005–1.030)
Squamous Epithelial / HPF: NONE SEEN (ref 0–5)
pH: 5 (ref 5.0–8.0)

## 2020-04-08 LAB — BASIC METABOLIC PANEL
Anion gap: 11 (ref 5–15)
BUN: 22 mg/dL — ABNORMAL HIGH (ref 6–20)
CO2: 26 mmol/L (ref 22–32)
Calcium: 9.2 mg/dL (ref 8.9–10.3)
Chloride: 102 mmol/L (ref 98–111)
Creatinine, Ser: 0.83 mg/dL (ref 0.61–1.24)
GFR, Estimated: >60 mL/min (ref >60)
Glucose, Bld: 134 mg/dL — ABNORMAL HIGH (ref 70–99)
Potassium: 3.2 mmol/L — ABNORMAL LOW (ref 3.5–5.1)
Sodium: 139 mmol/L (ref 135–145)

## 2020-04-08 NOTE — ED Triage Notes (Signed)
Pt to ED via POV with c/o R flank pain that started last Thursday. Pt states hx of kidney stones. Pt states pain starts from mid back and radiates around to R flank.

## 2020-04-09 ENCOUNTER — Emergency Department
Admission: EM | Admit: 2020-04-09 | Discharge: 2020-04-09 | Disposition: A | Discharge status: Home / Self Care | Payer: No Typology Code available for payment source | Attending: Emergency Medicine | Admitting: Emergency Medicine

## 2020-04-09 DIAGNOSIS — R109 Unspecified abdominal pain: Secondary | ICD-10-CM

## 2020-04-09 DIAGNOSIS — N200 Calculus of kidney: Secondary | ICD-10-CM

## 2020-04-09 LAB — LIPASE, BLOOD: Lipase: 20 U/L (ref 11–51)

## 2020-04-09 LAB — HEPATIC FUNCTION PANEL
ALT: 20 U/L (ref 0–44)
AST: 21 U/L (ref 15–41)
Albumin: 3.9 g/dL (ref 3.5–5.0)
Alkaline Phosphatase: 125 U/L (ref 38–126)
Bilirubin, Direct: <0.1 mg/dL (ref 0.0–0.2)
Total Bilirubin: 0.7 mg/dL (ref 0.3–1.2)
Total Protein: 7.7 g/dL (ref 6.5–8.1)

## 2020-04-09 LAB — TROPONIN I (HIGH SENSITIVITY): Troponin I (High Sensitivity): 7 ng/L (ref <18)

## 2020-04-09 MED ORDER — OXYCODONE-ACETAMINOPHEN 5-325 MG PO TABS
1.0000 | ORAL_TABLET | Freq: Once | ORAL | Status: AC
  Administered 2020-04-09: 1 via ORAL
  Filled 2020-04-09: qty 1

## 2020-04-09 MED ORDER — POTASSIUM CHLORIDE CRYS ER 20 MEQ PO TBCR
40.0000 meq | EXTENDED_RELEASE_TABLET | Freq: Once | ORAL | Status: AC
  Administered 2020-04-09: 40 meq via ORAL

## 2020-04-09 MED ORDER — ONDANSETRON 4 MG PO TBDP: 4.0000 mg | ORAL_TABLET | Freq: Three times a day (TID) | ORAL | 0 refills | Status: DC | PRN

## 2020-04-09 MED ORDER — HYDROMORPHONE HCL 1 MG/ML IJ SOLN
0.5000 mg | Freq: Once | INTRAMUSCULAR | Status: AC
  Administered 2020-04-09: 0.5 mg via INTRAVENOUS
  Filled 2020-04-09: qty 1

## 2020-04-09 MED ORDER — KETOROLAC TROMETHAMINE 30 MG/ML IJ SOLN
10.0000 mg | Freq: Once | INTRAMUSCULAR | Status: AC
  Administered 2020-04-09: 9.9 mg via INTRAVENOUS
  Filled 2020-04-09: qty 1

## 2020-04-09 MED ORDER — HYDROMORPHONE HCL 1 MG/ML IJ SOLN
0.5000 mg | Freq: Once | INTRAMUSCULAR | Status: AC
Start: 2020-04-09 — End: 2020-04-09
  Administered 2020-04-09: 0.5 mg via INTRAVENOUS
  Filled 2020-04-09: qty 1

## 2020-04-09 MED ORDER — SODIUM CHLORIDE 0.9 % IV BOLUS
1000.0000 mL | Freq: Once | INTRAVENOUS | Status: AC
  Administered 2020-04-09: 1000 mL via INTRAVENOUS

## 2020-04-09 MED ORDER — ONDANSETRON HCL 4 MG/2ML IJ SOLN
4.0000 mg | Freq: Once | INTRAMUSCULAR | Status: AC
  Administered 2020-04-09: 4 mg via INTRAVENOUS
  Filled 2020-04-09: qty 2

## 2020-04-09 MED ORDER — OXYCODONE-ACETAMINOPHEN 5-325 MG PO TABS: 1.0000 | ORAL_TABLET | ORAL | 0 refills | Status: DC | PRN

## 2020-04-09 NOTE — ED Provider Notes (Signed)
[facility] Emergency Department Provider Note   ____________________________________________   Event Date/Time   First MD Initiated Contact with Patient 04/09/20 [phone]     (approximate)  I have reviewed the triage vital signs and the nursing notes.   HISTORY  Chief Complaint Flank Pain    HPI Trevor Franklin is a 50 y.o. male who presents to the ED from home with a chief complaint of right flank pain.  Patient has a history of kidney stones, atrial fibrillation on Eliquis, hypertension, hyperlipidemia.  Reports right flank pain which began 5 days ago, worsening today and exacerbated by movement.  Sharp pain radiates from right flank to right abdomen.  Denies fever, chills, cough, chest pain, shortness of breath, nausea, vomiting, hematuria, diarrhea.  Denies testicular pain or swelling.  Had prostatitis over the summer treated with antibiotics.  Denies recent travel or trauma     Past Medical History:  Diagnosis Date  . A-fib (HCC)   . Abdominal pain   . Arrhythmia   . GERD (gastroesophageal reflux disease)   . Hyperlipidemia   . Hypertension   . Kidney stones     Patient Active Problem List   Diagnosis Date Noted  . Atrial fibrillation, unspecified 08/22/2014  . Pain in the chest 08/22/2014  . Smoker 08/22/2014  . SVT (supraventricular tachycardia) (HCC) 08/22/2014  . Hyperlipidemia 08/22/2014  . AP (abdominal pain) 03/17/2014    Past Surgical History:  Procedure Laterality Date  . KNEE SURGERY     age 1    Prior to Admission medications   Medication Sig Start Date End Date Taking? Authorizing Provider  ondansetron (ZOFRAN ODT) 4 MG disintegrating tablet Take 1 tablet (4 mg total) by mouth every 8 (eight) hours as needed for nausea or vomiting. 04/09/20  Yes Irean Hong, MD  oxyCODONE-acetaminophen (PERCOCET/ROXICET) 5-325 MG tablet Take 1 tablet by mouth every 4 (four) hours as needed for severe pain. 04/09/20  Yes Irean Hong, MD   apixaban (ELIQUIS) 5 MG TABS tablet Take 1 tablet (5 mg total) by mouth 2 (two) times daily. 08/02/18   Nita Sickle, MD  atorvastatin (LIPITOR) 40 MG tablet Take 40 mg by mouth every evening. 01/04/14   [provider]  cyclobenzaprine (FLEXERIL) 10 MG tablet Take 10 mg by mouth 3 (three) times daily. 07/26/18   [provider]  diltiazem (TIAZAC) 240 MG 24 hr capsule Take 240 mg by mouth daily. 01/04/14   [provider]  doxycycline (MONODOX) 100 MG capsule Take 1 capsule (100 mg total) by mouth 2 (two) times daily. 10/25/19   Joni Reining, PA-C  lisinopril (ZESTRIL) 10 MG tablet Take 5 mg by mouth daily. 06/28/18   [provider]  omeprazole (PRILOSEC) 20 MG capsule Take 20 mg by mouth daily. 01/04/14   [provider]  POTASSIUM PO Take 595 mg by mouth daily.     [provider]  sucralfate (CARAFATE) 1 GM/10ML suspension Take 10 mLs (1 g total) by mouth 4 (four) times daily. 09/20/19 09/19/20  Miguel Aschoff., MD  sulfamethoxazole-trimethoprim (BACTRIM DS) 800-160 MG tablet Take 1 tablet by mouth daily. 11/28/19   Stoioff, Verna Czech, MD  tamsulosin (FLOMAX) 0.4 MG CAPS capsule TAKE 1 CAPSULE BY MOUTH EVERY DAY 12/20/19   Stoioff, Verna Czech, MD    Allergies Penicillins  Family History  Problem Relation Age of Onset  . Heart disease Mother   . Heart attack Mother     Social  History Social History   Tobacco Use  . Smoking status: Current Every Day Smoker    Packs/day: 1.00    Years: 30.00    Pack years: 30.00  . Smokeless tobacco: Never Used  Substance Use Topics  . Alcohol use: Yes    Comment: social  . Drug use: No    Review of Systems  Constitutional: No fever/chills Eyes: No visual changes. ENT: No sore throat. Cardiovascular: Denies chest pain. Respiratory: Denies shortness of breath. Gastrointestinal: Positive for right flank to right abdominal pain.  No nausea, no vomiting.  No diarrhea.  No  constipation. Genitourinary: Negative for dysuria. Musculoskeletal: Negative for back pain. Skin: Negative for rash. Neurological: Negative for headaches, focal weakness or numbness.   ____________________________________________   PHYSICAL EXAM:  VITAL SIGNS: ED Triage Vitals  Enc Vitals Group     BP 04/08/20 1800 (!) 154/97     Pulse Rate 04/08/20 1800 82     Resp 04/08/20 1800 18     Temp 04/08/20 1800 97.9 F (36.6 C)     Temp Source 04/08/20 1800 Oral     SpO2 04/08/20 1800 96 %     Weight 04/08/20 1801 190 lb (86.2 kg)     Height 04/08/20 1801 6\' 1"  (1.854 m)     Head Circumference --      Peak Flow --      Pain Score 04/08/20 1801 8     Pain Loc --      Pain Edu? --      Excl. in GC? --     Constitutional: Alert and oriented. Well appearing and in mild acute distress. Eyes: Conjunctivae are normal. PERRL. EOMI. Head: Atraumatic. Nose: No congestion/rhinnorhea. Mouth/Throat: Mucous membranes are moist.   Neck: No stridor.   Cardiovascular: Normal rate, regular rhythm. Grossly normal heart sounds.  Good peripheral circulation. Respiratory: Normal respiratory effort.  No retractions. Lungs CTAB. Gastrointestinal: Soft and mildly tender to palpation right groin without rebound or guarding.  No palpable hernia or masses. No distention. No abdominal bruits.  Mild right CVA tenderness. Musculoskeletal: No lower extremity tenderness nor edema.  No joint effusions. Neurologic:  Normal speech and language. No gross focal neurologic deficits are appreciated. No gait instability. Skin:  Skin is warm, dry and intact. No rash noted. Psychiatric: Mood and affect are normal. Speech and behavior are normal.  ____________________________________________   LABS (all labs ordered are listed, but only abnormal results are displayed)  Labs Reviewed  URINALYSIS, COMPLETE (UACMP) WITH MICROSCOPIC - Abnormal; Notable for the following components:      Result Value   Color, Urine  YELLOW (*)    APPearance CLEAR (*)    All other components within normal limits  BASIC METABOLIC PANEL - Abnormal; Notable for the following components:   Potassium 3.2 (*)    Glucose, Bld 134 (*)    BUN 22 (*)    All other components within normal limits  CBC - Abnormal; Notable for the following components:   WBC 12.6 (*)    All other components within normal limits  HEPATIC FUNCTION PANEL  LIPASE, BLOOD  TROPONIN I (HIGH SENSITIVITY)   ____________________________________________  EKG  None ____________________________________________  RADIOLOGY I, Halee Glynn J, personally viewed and evaluated these images (plain radiographs) as part of my medical decision making, as well as reviewing the written report by the radiologist.  ED MD interpretation: Right nephrolithiasis  Official radiology report(s): CT Renal Stone Study  Result Date: 04/09/2020 CLINICAL DATA:  Right  flank pain EXAM: CT ABDOMEN AND PELVIS WITHOUT CONTRAST TECHNIQUE: Multidetector CT imaging of the abdomen and pelvis was performed following the standard protocol without IV contrast. COMPARISON:  03/01/2014 FINDINGS: Lower chest: Scarring in the lung bases.  No acute abnormality. Hepatobiliary: No focal hepatic abnormality. Gallbladder unremarkable. Pancreas: No focal abnormality or ductal dilatation. Spleen: No focal abnormality.  Normal size. Adrenals/Urinary Tract: 4 mm stone in the midpole of the right kidney. No hydronephrosis or ureteral stones. Adrenal glands and urinary bladder unremarkable. Stomach/Bowel: Normal appendix. Left colonic diverticulosis. No active diverticulitis. Stomach and small bowel decompressed, unremarkable. Vascular/Lymphatic: No evidence of aneurysm or adenopathy. Reproductive: Prostate enlargement, central calcifications. Other: No free fluid or free air. Musculoskeletal: No acute bony abnormality. IMPRESSION: Right nephrolithiasis.  No ureteral stones or hydronephrosis. Left colonic  diverticulosis. Electronically Signed   By: Charlett NoseKevin  Dover M.D.   On: 04/09/2020 00:16    ____________________________________________   PROCEDURES  Procedure(s) performed (including Critical Care):  Procedures   ____________________________________________   INITIAL IMPRESSION / ASSESSMENT AND PLAN / ED COURSE  As part of my medical decision making, I reviewed the following data within the electronic MEDICAL RECORD NUMBER Nursing notes reviewed and incorporated, Labs reviewed, Old chart reviewed, Radiograph reviewed, Notes from prior ED visits and [place] Controlled Substance Database     50 year old male with a history of kidney stones presenting with a several day history of right flank to right abdominal pain Differential diagnosis includes, but is not limited to, acute appendicitis, renal colic, testicular torsion, urinary tract infection/pyelonephritis, prostatitis,  epididymitis, diverticulitis, small bowel obstruction or ileus, colitis, abdominal aortic aneurysm, gastroenteritis, hernia, etc.  Patient is afebrile with mild leukocytosis and hypokalemia; clear urine.  CT imaging demonstrates right nephrolithiasis without ureteral stones or hydronephrosis.  We did discuss it is possible patient may have tiny stones not seen by CT scan.  We will initiate IV fluid hydration, analgesia, NSAIDs, antiemetic and reassess  Clinical Course as of 04/09/20 0703  Tue Apr 09, 2020  0300 Patient had another dose of IV pain medicines, or Percocet and is feeling better.  Strict return precautions given.  Patient verbalizes understanding agrees with plan of care [JS]    Clinical Course User Index [JS] Irean HongSung, Zakyia Gagan J, MD     ____________________________________________   FINAL CLINICAL IMPRESSION(S) / ED DIAGNOSES  Final diagnoses:  Right flank pain  Nephrolithiasis     ED Discharge Orders         Ordered    oxyCODONE-acetaminophen (PERCOCET/ROXICET) 5-325 MG tablet  Every 4 hours PRN         04/09/20 0256    ondansetron (ZOFRAN ODT) 4 MG disintegrating tablet  Every 8 hours PRN        04/09/20 0256          *Please note:  Marga MelnickJames M Cottrell was evaluated in Emergency Department on 04/09/2020 for the symptoms described in the history of present illness. He was evaluated in the context of the global COVID-19 pandemic, which necessitated consideration that the patient might be at risk for infection with the SARS-CoV-2 virus that causes COVID-19. Institutional protocols and algorithms that pertain to the evaluation of patients at risk for COVID-19 are in a state of rapid change based on information released by regulatory bodies including the CDC and federal and state organizations. These policies and algorithms were followed during the patient's care in the ED.  Some ED evaluations and interventions may be delayed as a result of limited staffing during and  the pandemic.*   Note:  This document was prepared using Dragon voice recognition software and may include unintentional dictation errors.   Irean Hong, MD 04/09/20 [phone]

## 2020-04-09 NOTE — Discharge Instructions (Signed)
1.  You may take medicines as needed for pain and nausea (Percocet/Zofran #20). 2.  Drink plenty of bottled or filtered water daily. 3.  Take a stool softener while you are having to take narcotic pain medicine. 4.  Return to the ER for worsening symptoms, persistent vomiting, difficulty breathing or other concerns.

## 2021-02-02 IMAGING — CR DG CHEST 2V
2 series · 2 of 2 positions shown · non-contrast
Comparison: 08/02/2018

CLINICAL DATA: Chest pain

EXAM:
CHEST - 2 VIEW

[chest pa]
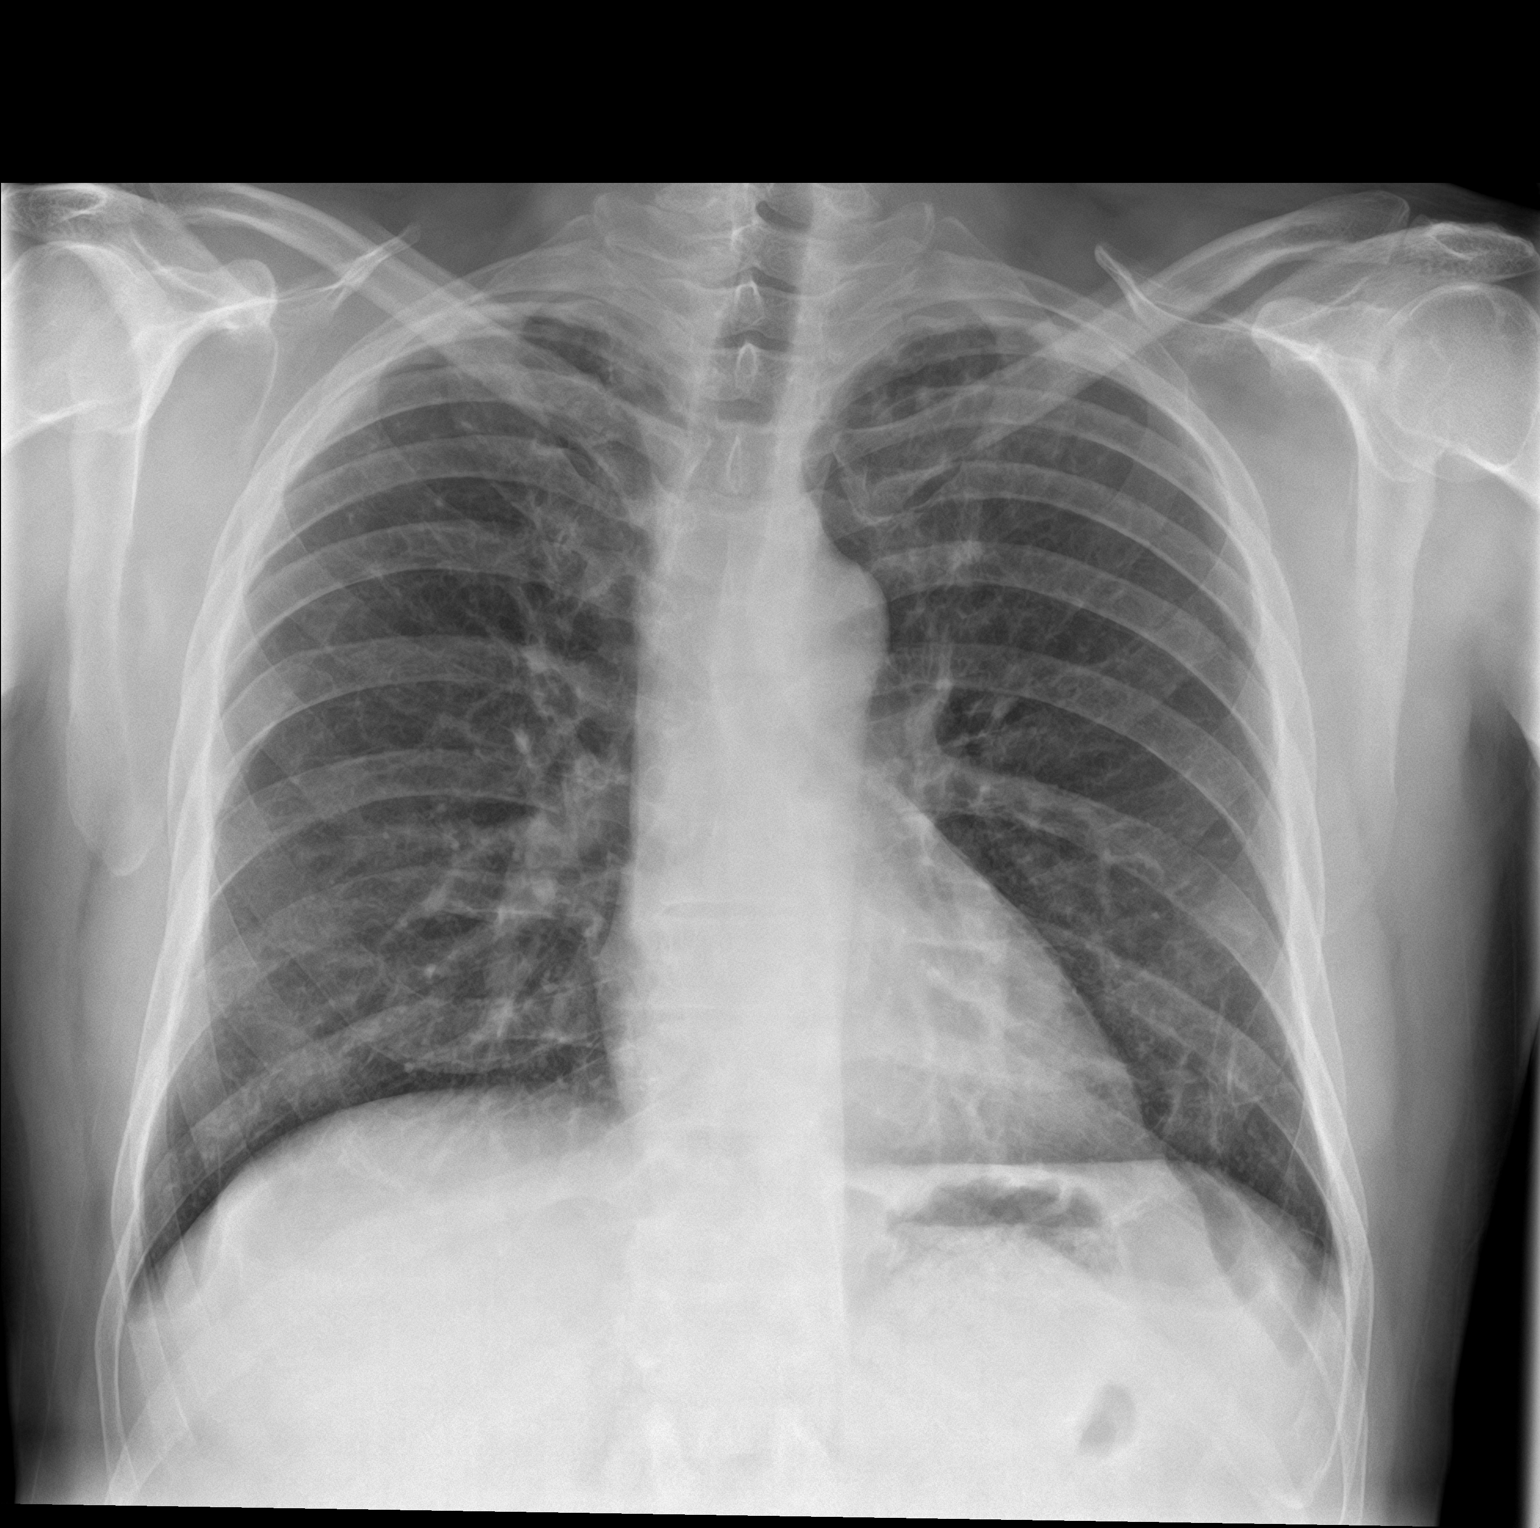

[chest lat]
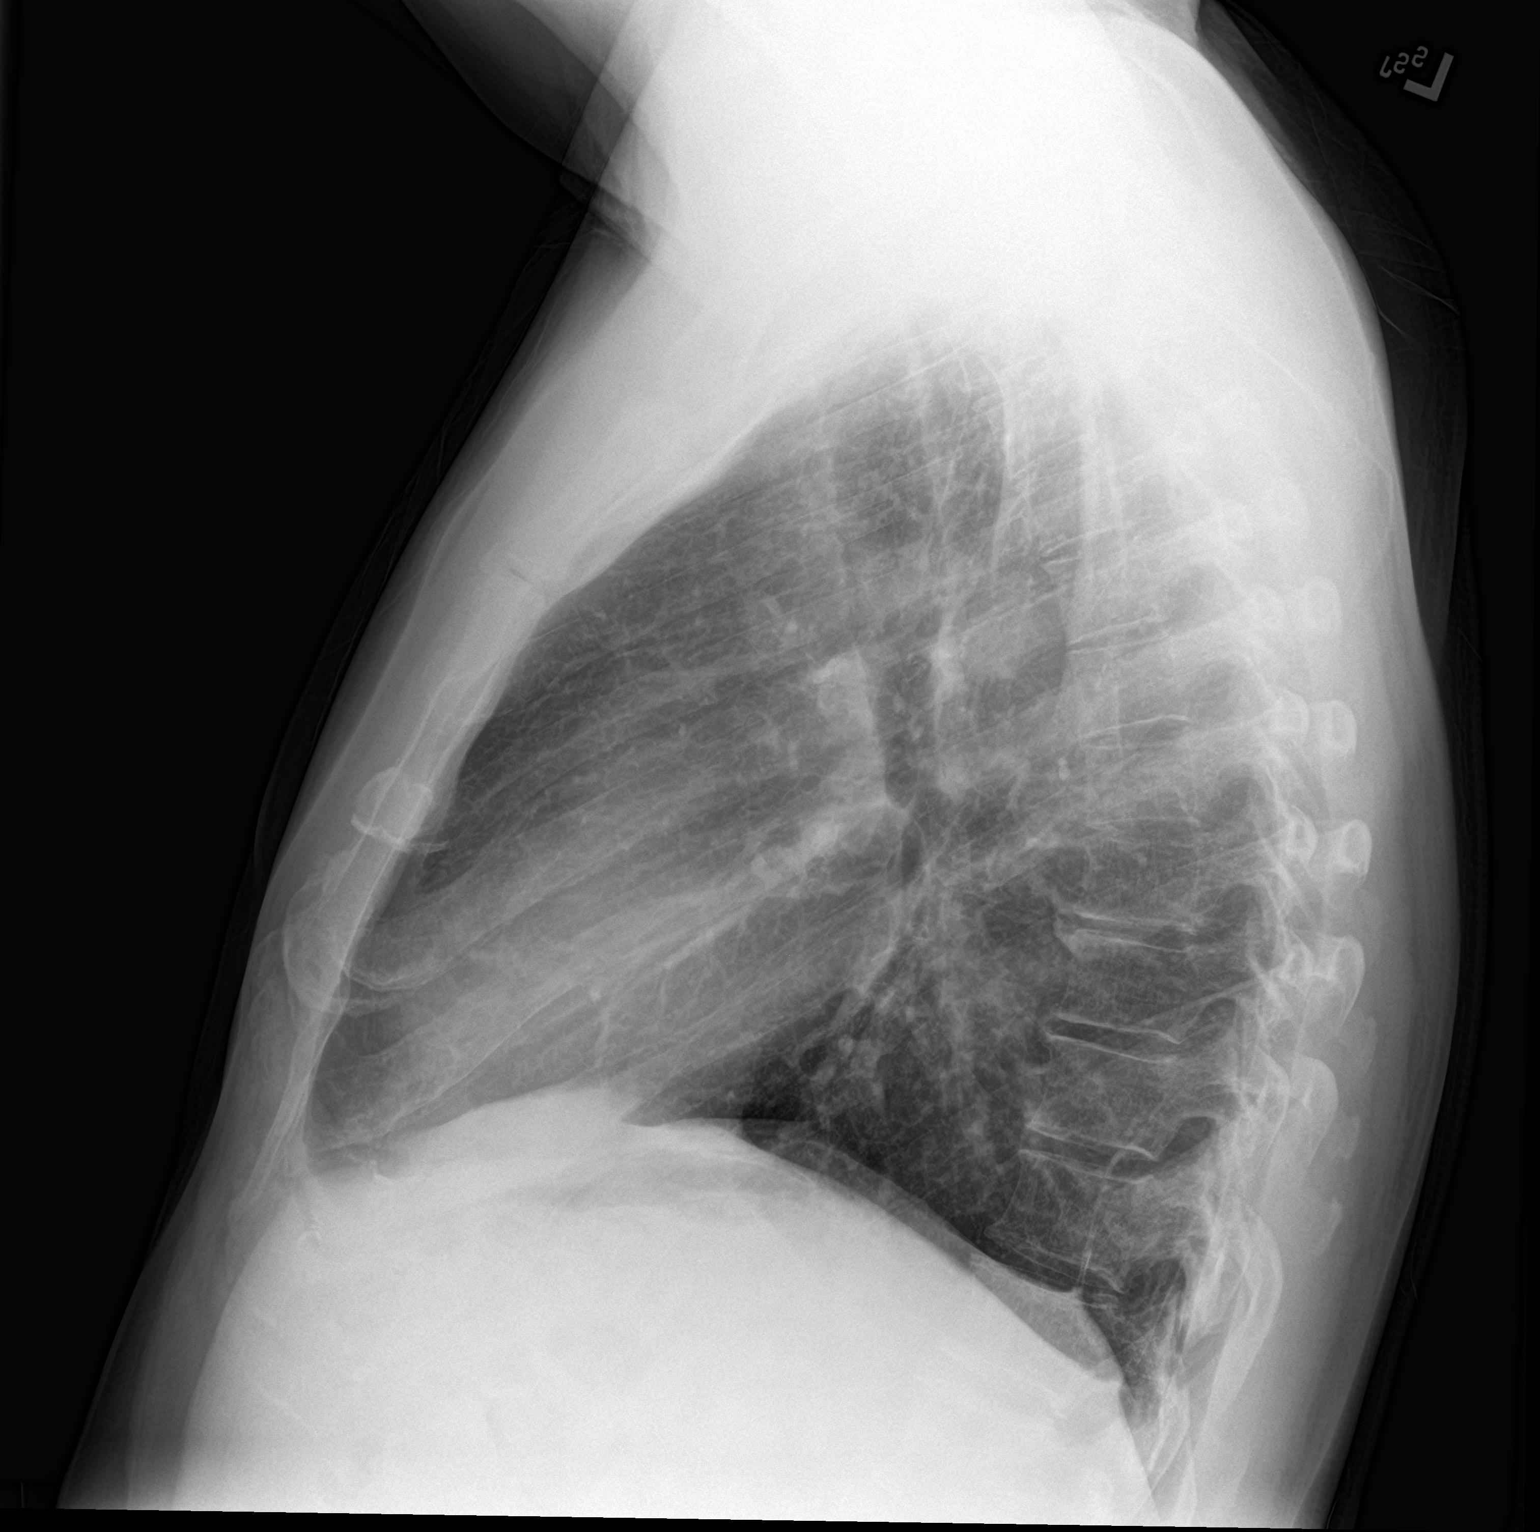

[2 of 2 positions shown; findings below may reference images not displayed]

FINDINGS: Heart is normal size. Linear scarring at the left base. Right lung
clear. No effusions. No acute bony abnormality.
IMPRESSION: No acute cardiopulmonary disease.

## 2021-06-12 IMAGING — US US SCROTUM W/ DOPPLER COMPLETE
1 series · 13 of 25 positions shown · non-contrast
Comparison: None.

CLINICAL DATA: 48-year-old with left testicular pain.  No injury.

EXAM:
SCROTAL ULTRASOUND
DOPPLER ULTRASOUND OF THE TESTICLES
TECHNIQUE: Complete ultrasound examination of the testicles, epididymis, and
other scrotal structures was performed. Color and spectral Doppler
ultrasound were also utilized to evaluate blood flow to the
testicles.

[Series 1: us scrotum w/doppler · 13 of 63 slices shown]
[im 1/63]
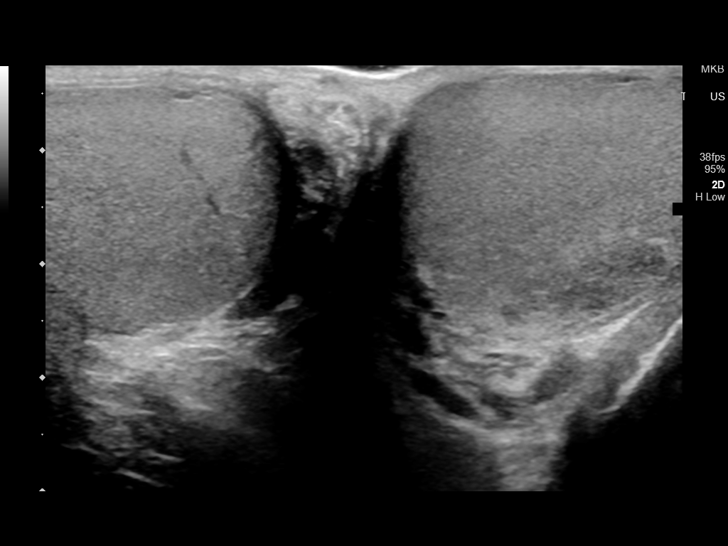
[im 6/63]
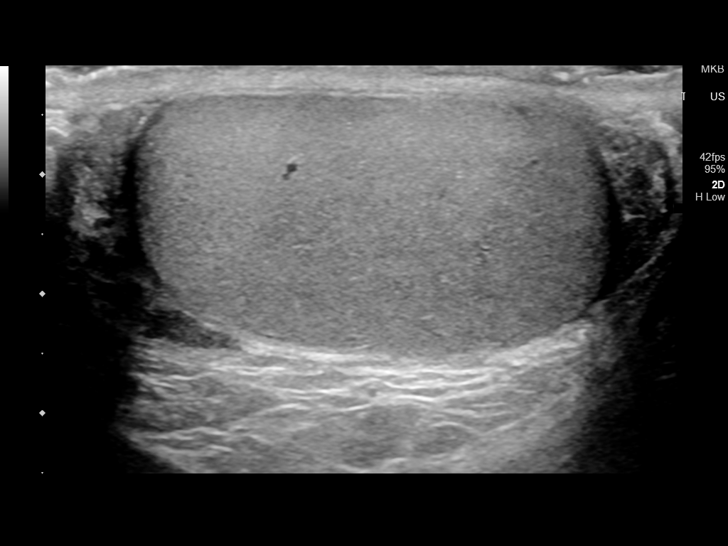
[im 11/63]
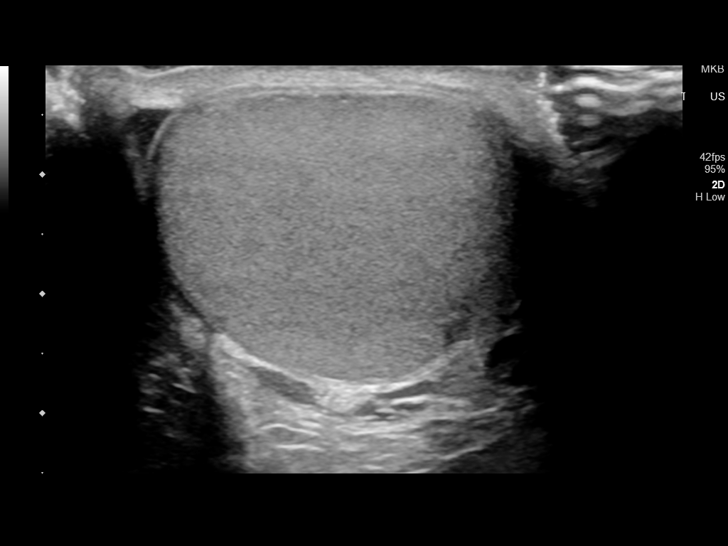
[im 16/63]
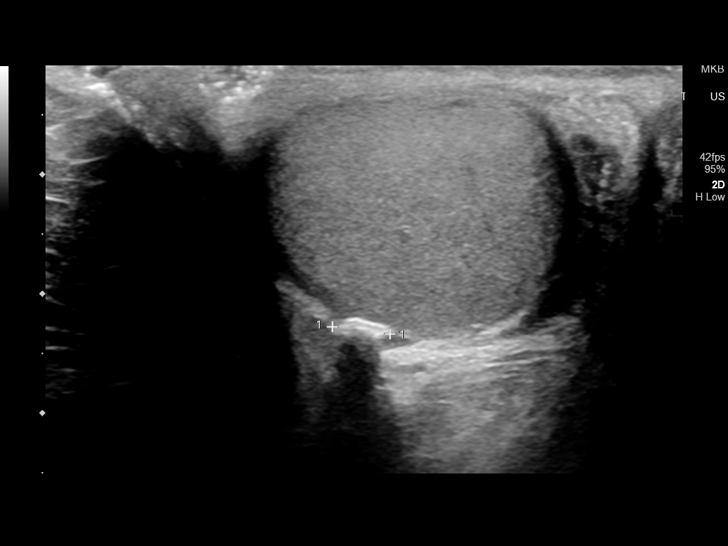
[im 21/63]
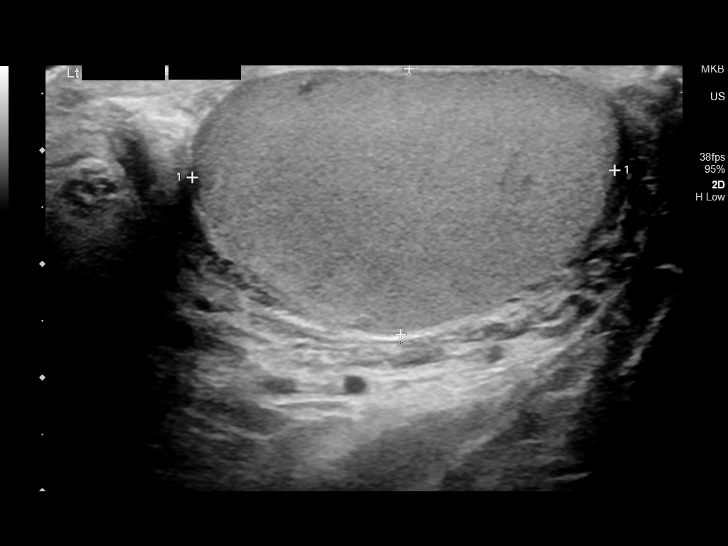
[im 26/63]
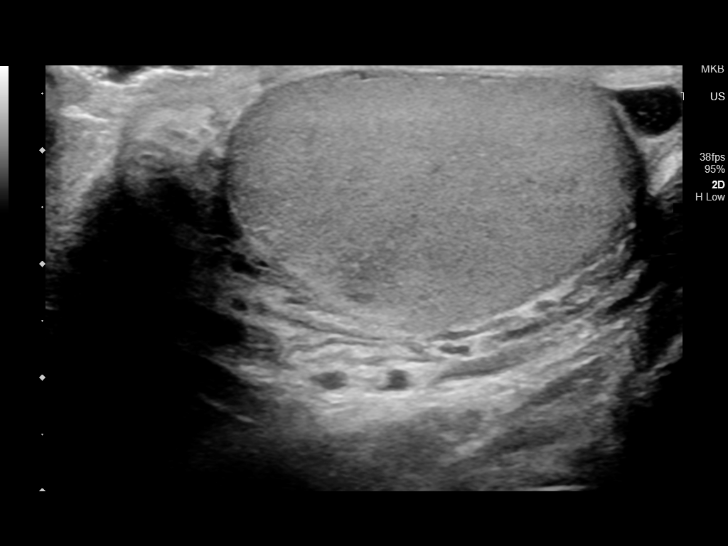
[im 32/63]
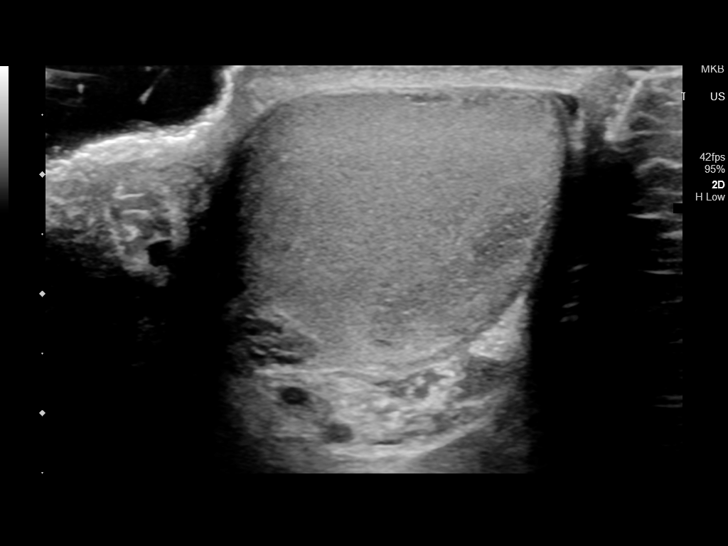
[im 37/63]
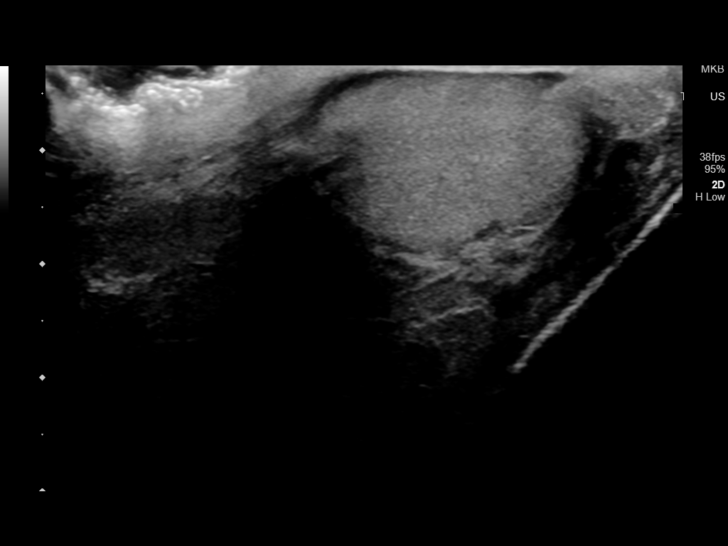
[im 42/63]
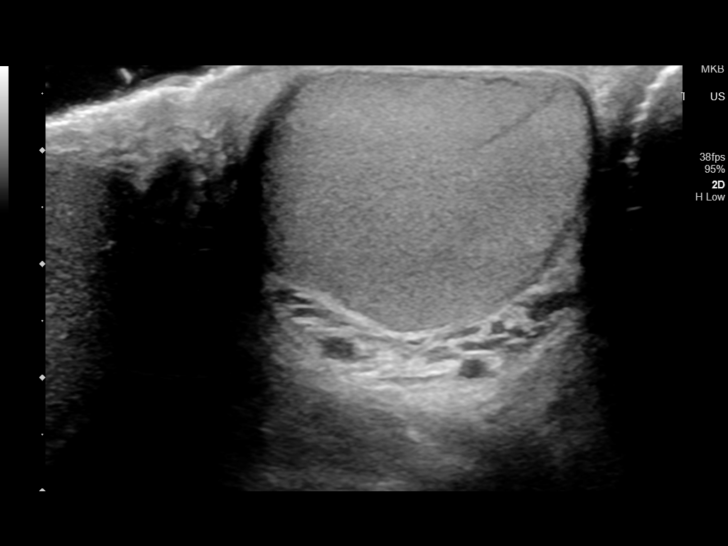
[im 47/63]
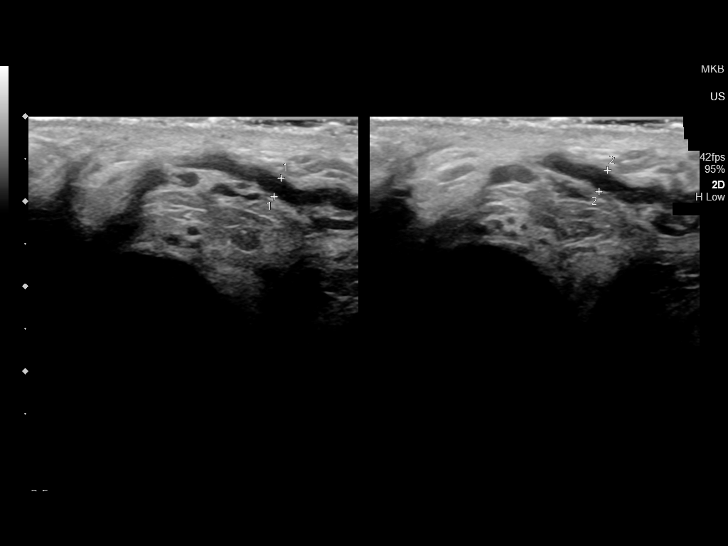
[im 52/63]
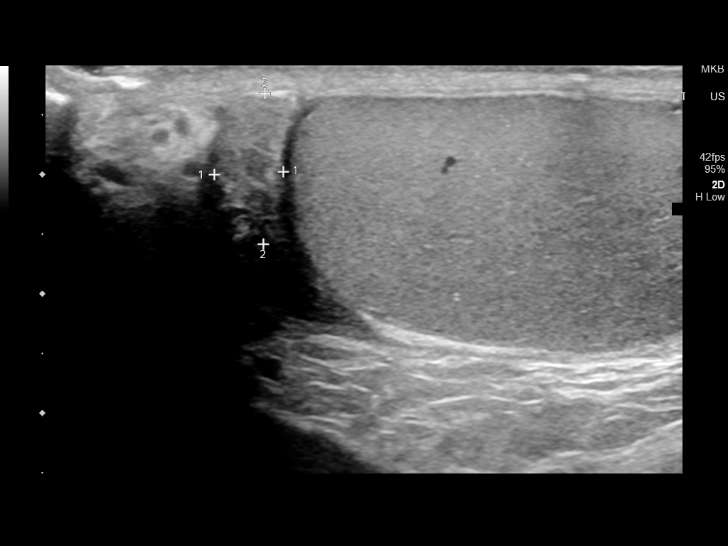
[im 57/63]
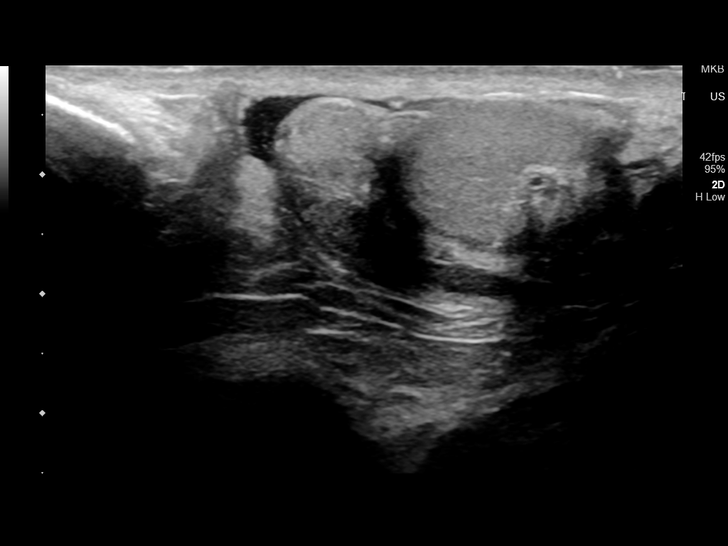
[im 63/63]
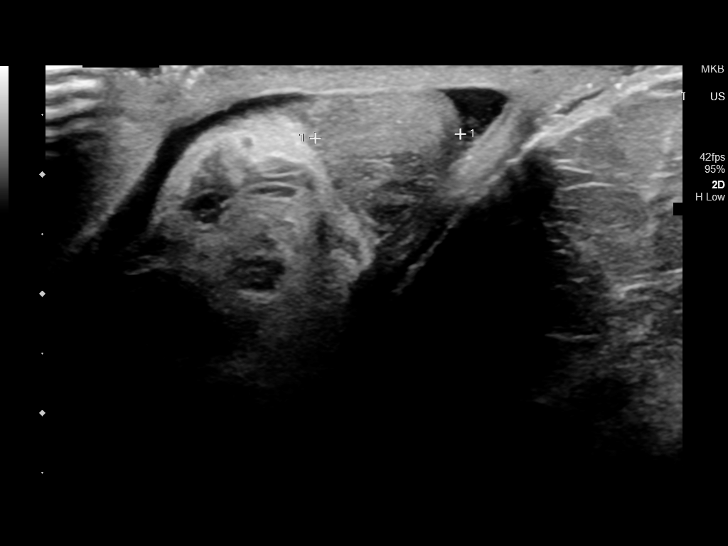

[13 of 25 positions shown; findings below may reference images not displayed]

FINDINGS: Right testicle

Measurements: 3.9 x 2.4 x 3.0 cm. No intratesticular mass or
microlithiasis visualized. 0.5 cm echogenic structure with posterior
acoustic shadowing along the periphery of the right testicle. This
is suggestive for an extratesticular calcification such as a scrotal
pearl.

Left testicle

Measurements: 3.7 x 2.3 x 2.9 cm. Poorly defined hypoechoic area
along the periphery of the left testicle without significant
vascular flow in this area. This poorly defined area measures 1.8 x
0.8 x 1.9 cm. Increased vascularity in the left testicle compared to
the right.

Right epididymis:  Normal in size and appearance.

Left epididymis: Normal in size and appearance. Slightly increased
vascularity compared to the right.

Hydrocele:  Small bilateral hydroceles.

Varicocele:  None visualized.

Pulsed Doppler interrogation of both testes demonstrates normal low
resistance arterial and venous waveforms bilaterally.
IMPRESSION: 1. Slightly increased vascularity in the left testicle and left
epididymis compared to the right side. Findings are suggestive for
left epididymo-orchitis.
2. Poorly defined hypoechoic area in the periphery of the left
testicle without significant vascular flow. Based on the hyperemia
in the left testicle, suspect this could represent a small focus of
infection or even a developing abscess. However, neoplasm in the
left testicle cannot be excluded and recommend urology consultation.
3. Right scrotal calcification.
4. Negative for testicular torsion.

These results were called by telephone at the time of interpretation
on 10/25/2019 at [DATE] to provider YUKO JT , who verbally
acknowledged these results.

## 2021-11-07 ENCOUNTER — Other Ambulatory Visit: Payer: Self-pay

## 2021-11-07 ENCOUNTER — Encounter: Payer: Self-pay | Admitting: Emergency Medicine

## 2021-11-07 ENCOUNTER — Emergency Department: Payer: No Typology Code available for payment source

## 2021-11-07 DIAGNOSIS — Z5321 Procedure and treatment not carried out due to patient leaving prior to being seen by health care provider: Secondary | ICD-10-CM | POA: Diagnosis not present

## 2021-11-07 DIAGNOSIS — R072 Precordial pain: Secondary | ICD-10-CM | POA: Insufficient documentation

## 2021-11-07 LAB — CBC
HCT: 48.4 % (ref 39.0–52.0)
Hemoglobin: 16.8 g/dL (ref 13.0–17.0)
MCH: 30.4 pg (ref 26.0–34.0)
MCHC: 34.7 g/dL (ref 30.0–36.0)
MCV: 87.5 fL (ref 80.0–100.0)
Platelets: 230 10*3/uL (ref 150–400)
RBC: 5.53 MIL/uL (ref 4.22–5.81)
RDW: 13.5 % (ref 11.5–15.5)
WBC: 10.8 10*3/uL — ABNORMAL HIGH (ref 4.0–10.5)
nRBC: 0 % (ref 0.0–0.2)

## 2021-11-07 NOTE — ED Triage Notes (Signed)
Pt to ED via POV, states hx of A-fib, states has been feeling "flutters" which is not abnormal for him due to hx of a-fib, states for last 3 days has been having substernal CP that radiates to his neck. Pt states pressure in his chest.

## 2021-11-08 ENCOUNTER — Emergency Department
Admission: EM | Admit: 2021-11-08 | Discharge: 2021-11-08 | Discharge status: Against Medical Advice | Payer: No Typology Code available for payment source | Attending: Student in an Organized Health Care Education/Training Program | Admitting: Student in an Organized Health Care Education/Training Program

## 2021-11-08 LAB — BASIC METABOLIC PANEL
Anion gap: 11 (ref 5–15)
BUN: 18 mg/dL (ref 6–20)
CO2: 24 mmol/L (ref 22–32)
Calcium: 9.4 mg/dL (ref 8.9–10.3)
Chloride: 105 mmol/L (ref 98–111)
Creatinine, Ser: 0.87 mg/dL (ref 0.61–1.24)
GFR, Estimated: >60 mL/min (ref >60)
Glucose, Bld: 90 mg/dL (ref 70–99)
Potassium: 3.1 mmol/L — ABNORMAL LOW (ref 3.5–5.1)
Sodium: 140 mmol/L (ref 135–145)

## 2021-11-08 LAB — TROPONIN I (HIGH SENSITIVITY)
Troponin I (High Sensitivity): 8 ng/L (ref <18)
Troponin I (High Sensitivity): 10 ng/L (ref <18)

## 2021-12-17 ENCOUNTER — Other Ambulatory Visit: Payer: Self-pay

## 2021-12-17 ENCOUNTER — Ambulatory Visit
Admission: RE | Admit: 2021-12-17 | Payer: No Typology Code available for payment source | Source: Home / Self Care | Admitting: Cardiology

## 2021-12-17 ENCOUNTER — Encounter: Payer: Self-pay | Admitting: Cardiology

## 2021-12-17 ENCOUNTER — Encounter: Admission: RE | Payer: Self-pay | Source: Home / Self Care

## 2021-12-17 ENCOUNTER — Encounter
Admission: RE | Disposition: A | Discharge status: Home / Self Care | Payer: Self-pay | Source: Home / Self Care | Attending: Cardiology

## 2021-12-17 ENCOUNTER — Ambulatory Visit
Admission: RE | Admit: 2021-12-17 | Discharge: 2021-12-17 | Disposition: A | Discharge status: Home / Self Care | Payer: No Typology Code available for payment source | Attending: Cardiology | Admitting: Cardiology

## 2021-12-17 DIAGNOSIS — R079 Chest pain, unspecified: Secondary | ICD-10-CM

## 2021-12-17 DIAGNOSIS — Z01818 Encounter for other preprocedural examination: Secondary | ICD-10-CM

## 2021-12-17 DIAGNOSIS — R9430 Abnormal result of cardiovascular function study, unspecified: Secondary | ICD-10-CM

## 2021-12-17 DIAGNOSIS — I2510 Atherosclerotic heart disease of native coronary artery without angina pectoris: Secondary | ICD-10-CM | POA: Insufficient documentation

## 2021-12-17 DIAGNOSIS — Z7901 Long term (current) use of anticoagulants: Secondary | ICD-10-CM | POA: Insufficient documentation

## 2021-12-17 HISTORY — PX: LEFT HEART CATH AND CORONARY ANGIOGRAPHY: CATH118249

## 2021-12-17 SURGERY — LEFT HEART CATH AND CORONARY ANGIOGRAPHY
Anesthesia: Moderate Sedation

## 2021-12-17 MED ORDER — SODIUM CHLORIDE 0.9 % IV SOLN: 250.0000 mL | INTRAVENOUS | Status: DC | PRN

## 2021-12-17 MED ORDER — HEPARIN SODIUM (PORCINE) 1000 UNIT/ML IJ SOLN
INTRAMUSCULAR | Status: DC | PRN
  Administered 2021-12-17: 5000 [IU] via INTRAVENOUS

## 2021-12-17 MED ORDER — HEPARIN (PORCINE) IN NACL 1000-0.9 UT/500ML-% IV SOLN
INTRAVENOUS | Status: AC
  Filled 2021-12-17: qty 1000

## 2021-12-17 MED ORDER — HYDRALAZINE HCL 20 MG/ML IJ SOLN: 10.0000 mg | INTRAMUSCULAR | Status: DC | PRN

## 2021-12-17 MED ORDER — ASPIRIN 81 MG PO CHEW
81.0000 mg | CHEWABLE_TABLET | ORAL | Status: AC
  Administered 2021-12-17: 81 mg via ORAL

## 2021-12-17 MED ORDER — MIDAZOLAM HCL 2 MG/2ML IJ SOLN
INTRAMUSCULAR | Status: DC | PRN
  Administered 2021-12-17: 1 mg via INTRAVENOUS

## 2021-12-17 MED ORDER — LIDOCAINE HCL 1 % IJ SOLN
INTRAMUSCULAR | Status: AC
  Filled 2021-12-17: qty 20

## 2021-12-17 MED ORDER — SODIUM CHLORIDE 0.9% FLUSH: 3.0000 mL | Freq: Two times a day (BID) | INTRAVENOUS | Status: DC

## 2021-12-17 MED ORDER — HEPARIN SODIUM (PORCINE) 1000 UNIT/ML IJ SOLN
INTRAMUSCULAR | Status: AC
  Filled 2021-12-17: qty 10

## 2021-12-17 MED ORDER — LABETALOL HCL 5 MG/ML IV SOLN: 10.0000 mg | INTRAVENOUS | Status: DC | PRN

## 2021-12-17 MED ORDER — VERAPAMIL HCL 2.5 MG/ML IV SOLN
INTRAVENOUS | Status: DC | PRN
  Administered 2021-12-17: 2.5 mg via INTRA_ARTERIAL

## 2021-12-17 MED ORDER — FENTANYL CITRATE (PF) 100 MCG/2ML IJ SOLN
INTRAMUSCULAR | Status: DC | PRN
  Administered 2021-12-17: 25 ug via INTRAVENOUS

## 2021-12-17 MED ORDER — SODIUM CHLORIDE 0.9 % WEIGHT BASED INFUSION: 1.0000 mL/kg/h | INTRAVENOUS | Status: DC

## 2021-12-17 MED ORDER — ASPIRIN 81 MG PO CHEW
CHEWABLE_TABLET | ORAL | Status: AC
  Filled 2021-12-17: qty 1

## 2021-12-17 MED ORDER — SODIUM CHLORIDE 0.9 % WEIGHT BASED INFUSION
3.0000 mL/kg/h | INTRAVENOUS | Status: AC
  Administered 2021-12-17: 3 mL/kg/h via INTRAVENOUS

## 2021-12-17 MED ORDER — HEPARIN (PORCINE) IN NACL 2000-0.9 UNIT/L-% IV SOLN
INTRAVENOUS | Status: DC | PRN
  Administered 2021-12-17: 1000 mL

## 2021-12-17 MED ORDER — MIDAZOLAM HCL 2 MG/2ML IJ SOLN
INTRAMUSCULAR | Status: AC
  Filled 2021-12-17: qty 2

## 2021-12-17 MED ORDER — ACETAMINOPHEN 325 MG PO TABS: 650.0000 mg | ORAL_TABLET | ORAL | Status: DC | PRN

## 2021-12-17 MED ORDER — SODIUM CHLORIDE 0.9% FLUSH: 3.0000 mL | INTRAVENOUS | Status: DC | PRN

## 2021-12-17 MED ORDER — ONDANSETRON HCL 4 MG/2ML IJ SOLN: 4.0000 mg | Freq: Four times a day (QID) | INTRAMUSCULAR | Status: DC | PRN

## 2021-12-17 MED ORDER — VERAPAMIL HCL 2.5 MG/ML IV SOLN
INTRAVENOUS | Status: AC
  Filled 2021-12-17: qty 2

## 2021-12-17 MED ORDER — SODIUM CHLORIDE 0.9 % WEIGHT BASED INFUSION
1.0000 mL/kg/h | INTRAVENOUS | Status: DC
  Administered 2021-12-17: 1 mL/kg/h via INTRAVENOUS

## 2021-12-17 MED ORDER — IOHEXOL 300 MG/ML  SOLN
INTRAMUSCULAR | Status: DC | PRN
  Administered 2021-12-17: 98 mL

## 2021-12-17 MED ORDER — FENTANYL CITRATE (PF) 100 MCG/2ML IJ SOLN
INTRAMUSCULAR | Status: AC
  Filled 2021-12-17: qty 2

## 2021-12-17 SURGICAL SUPPLY — 19 items
BAND ZEPHYR COMPRESS 30 LONG (HEMOSTASIS) IMPLANT
CATH 5FR JL3.5 JR4 ANG PIG MP (CATHETERS) IMPLANT
CATH INFINITI 5FR JL4 (CATHETERS) IMPLANT
DILATOR VESSEL 38 20CM 12FR (INTRODUCER) IMPLANT
DILATOR VESSEL 38 20CM 14FR (INTRODUCER) IMPLANT
DILATOR VESSEL 38 20CM 18FR (INTRODUCER) IMPLANT
DILATOR VESSEL 38 20CM 8FR (INTRODUCER) IMPLANT
GLIDESHEATH SLEND SS 6F .021 (SHEATH) IMPLANT
GUIDEWIRE INQWIRE 1.5J.035X260 (WIRE) IMPLANT
INQWIRE 1.5J .035X260CM (WIRE) ×1
NDL PERC 18GX7CM (NEEDLE) IMPLANT
NEEDLE PERC 18GX7CM (NEEDLE) IMPLANT
PACK CARDIAC CATH (CUSTOM PROCEDURE TRAY) ×1 IMPLANT
PROTECTION STATION PRESSURIZED (MISCELLANEOUS) ×1
SET ATX SIMPLICITY (MISCELLANEOUS) IMPLANT
SHEATH AVANTI 7FRX11 (SHEATH) IMPLANT
STATION PROTECTION PRESSURIZED (MISCELLANEOUS) IMPLANT
WIRE AMPLATZ SS-J .035X180CM (WIRE) IMPLANT
WIRE GUIDERIGHT .035X150 (WIRE) IMPLANT

## 2021-12-17 NOTE — Discharge Instructions (Signed)
Follow up with Dr Saralyn Pilar' office on Wednesday, 27 September at 2:30 pm.  Resume Eliquis tonight.

## 2021-12-17 NOTE — Progress Notes (Signed)
Pt ambulatory to toilet

## 2023-12-08 ENCOUNTER — Emergency Department

## 2023-12-08 ENCOUNTER — Emergency Department
Admission: EM | Admit: 2023-12-08 | Discharge: 2023-12-09 | Disposition: A | Discharge status: Home / Self Care | Attending: Emergency Medicine | Admitting: Emergency Medicine

## 2023-12-08 ENCOUNTER — Other Ambulatory Visit: Payer: Self-pay

## 2023-12-08 DIAGNOSIS — N200 Calculus of kidney: Secondary | ICD-10-CM

## 2023-12-08 DIAGNOSIS — I4891 Unspecified atrial fibrillation: Secondary | ICD-10-CM | POA: Insufficient documentation

## 2023-12-08 DIAGNOSIS — Z7901 Long term (current) use of anticoagulants: Secondary | ICD-10-CM | POA: Diagnosis not present

## 2023-12-08 DIAGNOSIS — R1031 Right lower quadrant pain: Secondary | ICD-10-CM | POA: Diagnosis present

## 2023-12-08 DIAGNOSIS — I10 Essential (primary) hypertension: Secondary | ICD-10-CM | POA: Insufficient documentation

## 2023-12-08 DIAGNOSIS — N132 Hydronephrosis with renal and ureteral calculous obstruction: Secondary | ICD-10-CM | POA: Diagnosis not present

## 2023-12-08 DIAGNOSIS — R109 Unspecified abdominal pain: Secondary | ICD-10-CM

## 2023-12-08 LAB — CBC WITH DIFFERENTIAL/PLATELET
Abs Immature Granulocytes: 0.04 K/uL (ref 0.00–0.07)
Basophils Absolute: 0.1 K/uL (ref 0.0–0.1)
Basophils Relative: 1 %
Eosinophils Absolute: 0.1 K/uL (ref 0.0–0.5)
Eosinophils Relative: 1 %
HCT: 47.5 % (ref 39.0–52.0)
Hemoglobin: 16.9 g/dL (ref 13.0–17.0)
Immature Granulocytes: 0 %
Lymphocytes Relative: 12 %
Lymphs Abs: 1.5 K/uL (ref 0.7–4.0)
MCH: 31 pg (ref 26.0–34.0)
MCHC: 35.6 g/dL (ref 30.0–36.0)
MCV: 87.2 fL (ref 80.0–100.0)
Monocytes Absolute: 0.7 K/uL (ref 0.1–1.0)
Monocytes Relative: 5 %
Neutro Abs: 10.2 K/uL — ABNORMAL HIGH (ref 1.7–7.7)
Neutrophils Relative %: 81 %
Platelets: 230 K/uL (ref 150–400)
RBC: 5.45 MIL/uL (ref 4.22–5.81)
RDW: 12.9 % (ref 11.5–15.5)
WBC: 12.7 K/uL — ABNORMAL HIGH (ref 4.0–10.5)
nRBC: 0 % (ref 0.0–0.2)

## 2023-12-08 LAB — BASIC METABOLIC PANEL WITH GFR
Anion gap: 12 (ref 5–15)
BUN: 21 mg/dL — ABNORMAL HIGH (ref 6–20)
CO2: 22 mmol/L (ref 22–32)
Calcium: 9.1 mg/dL (ref 8.9–10.3)
Chloride: 104 mmol/L (ref 98–111)
Creatinine, Ser: 1.02 mg/dL (ref 0.61–1.24)
GFR, Estimated: >60 mL/min (ref >60)
Glucose, Bld: 137 mg/dL — ABNORMAL HIGH (ref 70–99)
Potassium: 3.7 mmol/L (ref 3.5–5.1)
Sodium: 138 mmol/L (ref 135–145)

## 2023-12-08 MED ORDER — OXYCODONE HCL 5 MG PO TABS: 5.0000 mg | ORAL_TABLET | Freq: Three times a day (TID) | ORAL | 0 refills | Status: DC | PRN

## 2023-12-08 MED ORDER — ONDANSETRON 4 MG PO TBDP: 4.0000 mg | ORAL_TABLET | Freq: Three times a day (TID) | ORAL | 0 refills | Status: DC | PRN

## 2023-12-08 MED ORDER — TAMSULOSIN HCL 0.4 MG PO CAPS
0.4000 mg | ORAL_CAPSULE | Freq: Once | ORAL | Status: AC
  Administered 2023-12-08: 0.4 mg via ORAL
  Filled 2023-12-08: qty 1

## 2023-12-08 MED ORDER — MORPHINE SULFATE (PF) 4 MG/ML IV SOLN
4.0000 mg | Freq: Once | INTRAVENOUS | Status: AC
  Administered 2023-12-08: 4 mg via INTRAVENOUS
  Filled 2023-12-08: qty 1

## 2023-12-08 MED ORDER — TAMSULOSIN HCL 0.4 MG PO CAPS: 0.4000 mg | ORAL_CAPSULE | Freq: Every day | ORAL | 0 refills | Status: DC

## 2023-12-08 MED ORDER — ONDANSETRON HCL 4 MG/2ML IJ SOLN
4.0000 mg | Freq: Once | INTRAMUSCULAR | Status: AC
  Administered 2023-12-08: 4 mg via INTRAVENOUS
  Filled 2023-12-08: qty 2

## 2023-12-08 MED ORDER — SODIUM CHLORIDE 0.9 % IV BOLUS
1000.0000 mL | Freq: Once | INTRAVENOUS | Status: AC
  Administered 2023-12-08: 1000 mL via INTRAVENOUS

## 2023-12-08 MED ORDER — KETOROLAC TROMETHAMINE 15 MG/ML IJ SOLN
15.0000 mg | Freq: Once | INTRAMUSCULAR | Status: AC
  Administered 2023-12-08: 15 mg via INTRAVENOUS
  Filled 2023-12-08: qty 1

## 2023-12-08 NOTE — Discharge Instructions (Addendum)
 You can take 650 mg of Tylenol  or 400 mg of ibuprofen  every 6 hours as needed for pain.  Please reserve the oxycodone  for severe breakthrough pain.  Please do not drive or operate heavy machinery while on the oxycodone .

## 2023-12-08 NOTE — ED Provider Notes (Signed)
 Trevor Franklin Provider Note    Event Date/Time   First MD Initiated Contact with Patient 12/08/23 2039     (approximate)   History   Flank Pain   HPI  Trevor Franklin is a 53 y.o. male with history of A-fib on Eliquis , GERD, nephrolithiasis, hypertension, hyperlipidemia, presenting with right flank pain.  No dysuria or hematuria.  Flank pain started earlier today.  States it feels very much like kidney stone pain.  Associate with nausea vomiting.  Has not been able to drink very much after 4 PM given the nausea.  Denies any trauma or falls.  States the pain radiates from the right flank to his right lower quadrant.  On independent review, he was seen by urology in 2021, had presented at that time for follow-up for epididymitis.  He denies any testicular or scrotal pain at this time.  Had a CT renal stone study done in 2022 that showed 4 mm stone in his right kidney without hydronephrosis or ureteral stones.     Physical Exam   Triage Vital Signs: ED Triage Vitals [12/08/23 2030]  Encounter Vitals Group     BP (!) 166/104     Girls Systolic BP Percentile      Girls Diastolic BP Percentile      Boys Systolic BP Percentile      Boys Diastolic BP Percentile      Pulse Rate 78     Resp 20     Temp 98.5 F (36.9 C)     Temp Source Oral     SpO2 96 %     Weight 196 lb (88.9 kg)     Height 6' 1 (1.854 m)     Head Circumference      Peak Flow      Pain Score 9     Pain Loc      Pain Education      Exclude from Growth Chart     Most recent vital signs: Vitals:   12/08/23 2030  BP: (!) 166/104  Pulse: 78  Resp: 20  Temp: 98.5 F (36.9 C)  SpO2: 96%     General: Awake, no distress.  CV:  Good peripheral perfusion.  Resp:  Normal effort.  Abd:  No distention.  Soft nontender Other:  No flank or CVA tenderness, no obvious rash   ED Results / Procedures / Treatments   Labs (all labs ordered are listed, but only abnormal results are  displayed) Labs Reviewed  CBC WITH DIFFERENTIAL/PLATELET - Abnormal; Notable for the following components:      Result Value   WBC 12.7 (*)    Neutro Abs 10.2 (*)    All other components within normal limits  BASIC METABOLIC PANEL WITH GFR - Abnormal; Notable for the following components:   Glucose, Bld 137 (*)    BUN 21 (*)    All other components within normal limits  URINALYSIS, ROUTINE W REFLEX MICROSCOPIC    RADIOLOGY On my independent interpretation, CT shows right hydronephrosis   PROCEDURES:  Critical Care performed: No  Procedures   MEDICATIONS ORDERED IN ED: Medications  sodium chloride  0.9 % bolus 1,000 mL (1,000 mLs Intravenous New Bag/Given 12/08/23 2140)  ondansetron  (ZOFRAN ) injection 4 mg (4 mg Intravenous Given 12/08/23 2141)  morphine  (PF) 4 MG/ML injection 4 mg (4 mg Intravenous Given 12/08/23 2141)  tamsulosin  (FLOMAX ) capsule 0.4 mg (0.4 mg Oral Given 12/08/23 2237)  ketorolac  (TORADOL ) 15 MG/ML injection 15  mg (15 mg Intravenous Given 12/08/23 2237)     IMPRESSION / MDM / ASSESSMENT AND PLAN / ED COURSE  I reviewed the triage vital signs and the nursing notes.                              Differential diagnosis includes, but is not limited to, nephrolithiasis, musculoskeletal pain, strain, UTI.  Will get labs, UA, CT renal stone study.  IV fluids, IV Zofran , IV morphine .  Reassess.  Patient's presentation is most consistent with acute presentation with potential threat to life or bodily function.  Independent interpretation of labs and imaging below.  Patient signed out pending UA, if not consistent with infected stone can be discharged with outpatient urology follow-up.  I have already sent Flomax , Zofran  and oxycodone  to his pharmacy.    Clinical Course as of 12/08/23 2315  Wed Dec 08, 2023  2132 CT Renal Stone Study IMPRESSION: 3 mm proximal right ureteral stone with mild right hydronephrosis and perinephric stranding. Punctate nonobstructing  right nephrolithiasis.  Left colonic diverticulosis.   [TT]  2133 Independent review of labs, mild leukocytosis, electrolytes not severely deranged, creatinine is normal. [TT]    Clinical Course User Index [TT] [place], Lorelle Cummins, MD     FINAL CLINICAL IMPRESSION(S) / ED DIAGNOSES   Final diagnoses:  Flank pain  Kidney stone     Rx / DC Orders   ED Discharge Orders          Ordered    tamsulosin  (FLOMAX ) 0.4 MG CAPS capsule  Daily        12/08/23 2241    oxyCODONE  (ROXICODONE ) 5 MG immediate release tablet  Every 8 hours PRN        12/08/23 2242    ondansetron  (ZOFRAN  ODT) 4 MG disintegrating tablet  Every 8 hours PRN        12/08/23 2315             Note:  This document was prepared using Dragon voice recognition software and may include unintentional dictation errors.    [name], MD 12/08/23 [phone]

## 2023-12-08 NOTE — ED Triage Notes (Signed)
 Pt reports right side flank pain that began earlier today, pt has hx kidney stones. Pt denies dysuria or hematuria.

## 2023-12-09 LAB — URINALYSIS, ROUTINE W REFLEX MICROSCOPIC
Bilirubin Urine: NEGATIVE
Glucose, UA: 50 mg/dL — AB
Ketones, ur: NEGATIVE mg/dL
Leukocytes,Ua: NEGATIVE
Nitrite: NEGATIVE
Protein, ur: 30 mg/dL — AB
RBC / HPF: >50 RBC/hpf (ref 0–5)
Specific Gravity, Urine: 1.014 (ref 1.005–1.030)
pH: 8 (ref 5.0–8.0)

## 2023-12-09 MED ORDER — OXYCODONE HCL 5 MG PO TABS
5.0000 mg | ORAL_TABLET | Freq: Once | ORAL | Status: AC
  Administered 2023-12-09: 5 mg via ORAL
  Filled 2023-12-09: qty 1

## 2023-12-09 MED ORDER — ACETAMINOPHEN 500 MG PO TABS
1000.0000 mg | ORAL_TABLET | Freq: Once | ORAL | Status: AC
  Administered 2023-12-09: 1000 mg via ORAL
  Filled 2023-12-09: qty 2

## 2023-12-09 MED ORDER — IBUPROFEN 600 MG PO TABS
600.0000 mg | ORAL_TABLET | Freq: Once | ORAL | Status: AC
  Administered 2023-12-09: 600 mg via ORAL
  Filled 2023-12-09: qty 1

## 2023-12-09 NOTE — ED Provider Notes (Signed)
 Patient resting comfortably.  Appropriate for discharge as the follow-up on the urinalysis does not appear infected   Cyrena Mylar, MD 12/09/23 0104

## 2023-12-12 ENCOUNTER — Encounter: Payer: Self-pay | Admitting: Emergency Medicine

## 2023-12-12 ENCOUNTER — Emergency Department

## 2023-12-12 ENCOUNTER — Other Ambulatory Visit: Payer: Self-pay

## 2023-12-12 DIAGNOSIS — R109 Unspecified abdominal pain: Secondary | ICD-10-CM | POA: Diagnosis present

## 2023-12-12 DIAGNOSIS — N179 Acute kidney failure, unspecified: Secondary | ICD-10-CM | POA: Diagnosis not present

## 2023-12-12 DIAGNOSIS — N200 Calculus of kidney: Secondary | ICD-10-CM | POA: Insufficient documentation

## 2023-12-12 LAB — URINALYSIS, ROUTINE W REFLEX MICROSCOPIC
Bacteria, UA: NONE SEEN
Bilirubin Urine: NEGATIVE
Glucose, UA: NEGATIVE mg/dL
Ketones, ur: NEGATIVE mg/dL
Nitrite: NEGATIVE
Protein, ur: NEGATIVE mg/dL
RBC / HPF: >50 RBC/hpf (ref 0–5)
Specific Gravity, Urine: 1.01 (ref 1.005–1.030)
Squamous Epithelial / HPF: 0 /HPF (ref 0–5)
WBC, UA: >50 WBC/hpf (ref 0–5)
pH: 5 (ref 5.0–8.0)

## 2023-12-12 LAB — BASIC METABOLIC PANEL WITH GFR
Anion gap: 9 (ref 5–15)
BUN: 19 mg/dL (ref 6–20)
CO2: 26 mmol/L (ref 22–32)
Calcium: 8.9 mg/dL (ref 8.9–10.3)
Chloride: 103 mmol/L (ref 98–111)
Creatinine, Ser: 1.52 mg/dL — ABNORMAL HIGH (ref 0.61–1.24)
GFR, Estimated: 54 mL/min — ABNORMAL LOW (ref >60)
Glucose, Bld: 111 mg/dL — ABNORMAL HIGH (ref 70–99)
Potassium: 3.4 mmol/L — ABNORMAL LOW (ref 3.5–5.1)
Sodium: 138 mmol/L (ref 135–145)

## 2023-12-12 LAB — CBC
HCT: 45.4 % (ref 39.0–52.0)
Hemoglobin: 16.1 g/dL (ref 13.0–17.0)
MCH: 30.6 pg (ref 26.0–34.0)
MCHC: 35.5 g/dL (ref 30.0–36.0)
MCV: 86.1 fL (ref 80.0–100.0)
Platelets: 228 K/uL (ref 150–400)
RBC: 5.27 MIL/uL (ref 4.22–5.81)
RDW: 12.5 % (ref 11.5–15.5)
WBC: 10.9 K/uL — ABNORMAL HIGH (ref 4.0–10.5)
nRBC: 0 % (ref 0.0–0.2)

## 2023-12-12 NOTE — ED Triage Notes (Signed)
 Patient c/o right flank pain since Wednesday.  Patient seen then for same. Patient has history of kidney stones.  Patient denies dysuria, states he has had some hematuria.

## 2023-12-13 ENCOUNTER — Emergency Department
Admission: EM | Admit: 2023-12-13 | Discharge: 2023-12-13 | Disposition: A | Discharge status: Home / Self Care | Attending: Emergency Medicine | Admitting: Emergency Medicine

## 2023-12-13 DIAGNOSIS — N179 Acute kidney failure, unspecified: Secondary | ICD-10-CM

## 2023-12-13 DIAGNOSIS — N23 Unspecified renal colic: Secondary | ICD-10-CM

## 2023-12-13 MED ORDER — LACTATED RINGERS IV BOLUS
1000.0000 mL | Freq: Once | INTRAVENOUS | Status: AC
  Administered 2023-12-13: 1000 mL via INTRAVENOUS

## 2023-12-13 MED ORDER — KETOROLAC TROMETHAMINE 30 MG/ML IJ SOLN
15.0000 mg | Freq: Once | INTRAMUSCULAR | Status: AC
  Administered 2023-12-13: 15 mg via INTRAVENOUS
  Filled 2023-12-13: qty 1

## 2023-12-13 MED ORDER — MORPHINE SULFATE (PF) 4 MG/ML IV SOLN
4.0000 mg | Freq: Once | INTRAVENOUS | Status: AC
  Administered 2023-12-13: 4 mg via INTRAVENOUS
  Filled 2023-12-13: qty 1

## 2023-12-13 MED ORDER — ONDANSETRON HCL 4 MG/2ML IJ SOLN
4.0000 mg | INTRAMUSCULAR | Status: AC
  Administered 2023-12-13: 4 mg via INTRAVENOUS
  Filled 2023-12-13: qty 2

## 2023-12-13 NOTE — ED Provider Notes (Signed)
 [facility] Provider Note    Event Date/Time   First MD Initiated Contact with Patient 12/13/23 0023     (approximate)   History   Flank Pain   HPI KENSON GROH is a 53 y.o. male who presents for worsening right flank pain in the setting of known kidney stone.  The patient was seen about 5 days ago and diagnosed with a kidney stone on the right.  No evidence of urinary tract infection.  He was treated appropriately and discharged.  He said he has had persistent pain since that time but it seemed to be worse tonight and he had more issue trying to urinate.  He has not had any fever and he has not had any nausea or vomiting since the pain first started 5 days ago.  He said that since coming here he could feel the pain moved from his right flank down to his bladder area and he feels bloated and like he is having a hard time emptying his bladder.  He has been taking the medication as prescribed but he has been trying to avoid the oxycodone  because he does not want to take too much of it.     Physical Exam   Triage Vital Signs: ED Triage Vitals  Encounter Vitals Group     BP 12/12/23 2033 (!) 164/108     Girls Systolic BP Percentile --      Girls Diastolic BP Percentile --      Boys Systolic BP Percentile --      Boys Diastolic BP Percentile --      Pulse Rate 12/12/23 2033 82     Resp 12/12/23 2033 18     Temp 12/12/23 2033 99.3 F (37.4 C)     Temp Source 12/12/23 2033 Oral     SpO2 12/12/23 2033 96 %     Weight 12/12/23 2034 88.5 kg (195 lb)     Height --      Head Circumference --      Peak Flow --      Pain Score 12/12/23 2034 5     Pain Loc --      Pain Education --      Exclude from Growth Chart --     Most recent vital signs: Vitals:   12/12/23 2033 12/13/23 0022  BP: (!) 164/108 (!) 155/95  Pulse: 82 86  Resp: 18 17  Temp: 99.3 F (37.4 C) 97.7 F (36.5 C)  SpO2: 96% 98%    General: Awake, alert, pleasant and conversant, appears  uncomfortable but not in significant amount of distress and certainly nontoxic. CV:  Good peripheral perfusion.   Resp:  Normal effort. Speaking easily and comfortably, no accessory muscle usage nor intercostal retractions.   Abd:  No obvious distention although the patient is holding his stomach due to subjective feeling of distention.  Mild tenderness to palpation of the suprapubic region.   ED Results / Procedures / Treatments   Labs (all labs ordered are listed, but only abnormal results are displayed) Labs Reviewed  URINALYSIS, ROUTINE W REFLEX MICROSCOPIC - Abnormal; Notable for the following components:      Result Value   Color, Urine YELLOW (*)    APPearance CLOUDY (*)    Hgb urine dipstick LARGE (*)    Leukocytes,Ua TRACE (*)    All other components within normal limits  BASIC METABOLIC PANEL WITH GFR - Abnormal; Notable for the following components:   Potassium 3.4 (*)  Glucose, Bld 111 (*)    Creatinine, Ser 1.52 (*)    GFR, Estimated 54 (*)    All other components within normal limits  CBC - Abnormal; Notable for the following components:   WBC 10.9 (*)    All other components within normal limits  URINE CULTURE     RADIOLOGY I independently viewed and interpreted the patient's CT renal stone protocol and there seems to be a small stone right next of the bladder.  Radiology confirmed migration of a small stone since the last image and comments that it may likely have been passed already post imaging.   PROCEDURES:  Critical Care performed: No  Procedures    IMPRESSION / MDM / ASSESSMENT AND PLAN / ED COURSE  I reviewed the triage vital signs and the nursing notes.                              Differential diagnosis includes, but is not limited to, persistent ureteral colic, UTI/pyonephritis, infected stone.  Patient's presentation is most consistent with acute presentation with potential threat to life or bodily function.  Labs/studies  ordered: Urinalysis, BMP, CBC, CT renal stone study  Interventions/Medications given:  Medications  lactated ringers  bolus 1,000 mL (0 mLs Intravenous Stopped 12/13/23 0319)  ketorolac  (TORADOL ) 30 MG/ML injection 15 mg (15 mg Intravenous Given 12/13/23 0058)  morphine  (PF) 4 MG/ML injection 4 mg (4 mg Intravenous Given 12/13/23 0059)  ondansetron  (ZOFRAN ) injection 4 mg (4 mg Intravenous Given 12/13/23 0058)    (Note:  hospital course my include additional interventions and/or labs/studies not listed above.)   Vitals are reassuring with no fever and no tachycardia.  No clinically significant leukocytosis.  Patient's urinalysis shows more white cells and trace leukocytes than it did before but no bacteria seen.  I will send a urine culture but likely not treat empirically because the stone seems to have migrated down to the bladder at this point and he may have actually passed it.  Given his discomfort we will treat with a liter of fluids (he is showing a mild AKI as well with a creatinine 1.5), Toradol  15 mg IV, morphine  4 mg IV, Zofran  4 mg IV, and reassess     Clinical Course as of 12/13/23 0322  Mon Dec 13, 2023  0320 Patient feels 100% better and feels ready to go.  Encouraging plenty of fluids and outpatient follow-up as needed.  I gave my usual and customary return precautions. [CF]    Clinical Course User Index [CF] Gordan Huxley, MD     FINAL CLINICAL IMPRESSION(S) / ED DIAGNOSES   Final diagnoses:  Ureteral colic  Acute kidney injury [facility])     Rx / DC Orders   ED Discharge Orders     None        Note:  This document was prepared using Dragon voice recognition software and may include unintentional dictation errors.   Gordan Huxley, MD 12/13/23 [phone]

## 2023-12-13 NOTE — Discharge Instructions (Signed)
 We believe the pain you are having is a result of the kidney stone that you probably have worked out by this point.  Please continue taking your medication as needed and as prescribed.  If you develop any new or worsening symptoms including fever or anything else that concerns you, please return immediately to the emergency department.  Make sure and continues to drink plenty of fluids because your lab suggested you are a bit dehydrated today.

## 2023-12-13 NOTE — ED Notes (Signed)
 Patient endorses feeling much better after medications and endorses readiness for discharge.

## 2023-12-14 LAB — URINE CULTURE: Culture: NO GROWTH

## 2024-01-05 ENCOUNTER — Other Ambulatory Visit: Payer: Self-pay

## 2024-01-05 ENCOUNTER — Emergency Department

## 2024-01-05 ENCOUNTER — Emergency Department
Admission: EM | Admit: 2024-01-05 | Discharge: 2024-01-05 | Disposition: A | Discharge status: Home / Self Care | Attending: Emergency Medicine | Admitting: Emergency Medicine

## 2024-01-05 DIAGNOSIS — Z7901 Long term (current) use of anticoagulants: Secondary | ICD-10-CM | POA: Insufficient documentation

## 2024-01-05 DIAGNOSIS — R0789 Other chest pain: Secondary | ICD-10-CM | POA: Diagnosis present

## 2024-01-05 DIAGNOSIS — I10 Essential (primary) hypertension: Secondary | ICD-10-CM | POA: Insufficient documentation

## 2024-01-05 DIAGNOSIS — I4891 Unspecified atrial fibrillation: Secondary | ICD-10-CM | POA: Diagnosis not present

## 2024-01-05 DIAGNOSIS — R079 Chest pain, unspecified: Secondary | ICD-10-CM

## 2024-01-05 LAB — CBC
HCT: 44.2 % (ref 39.0–52.0)
Hemoglobin: 15.4 g/dL (ref 13.0–17.0)
MCH: 30.1 pg (ref 26.0–34.0)
MCHC: 34.8 g/dL (ref 30.0–36.0)
MCV: 86.3 fL (ref 80.0–100.0)
Platelets: 204 K/uL (ref 150–400)
RBC: 5.12 MIL/uL (ref 4.22–5.81)
RDW: 12.5 % (ref 11.5–15.5)
WBC: 7.4 K/uL (ref 4.0–10.5)
nRBC: 0 % (ref 0.0–0.2)

## 2024-01-05 LAB — BASIC METABOLIC PANEL WITH GFR
Anion gap: 9 (ref 5–15)
BUN: 19 mg/dL (ref 6–20)
CO2: 22 mmol/L (ref 22–32)
Calcium: 8.5 mg/dL — ABNORMAL LOW (ref 8.9–10.3)
Chloride: 108 mmol/L (ref 98–111)
Creatinine, Ser: 0.72 mg/dL (ref 0.61–1.24)
GFR, Estimated: >60 mL/min (ref >60)
Glucose, Bld: 143 mg/dL — ABNORMAL HIGH (ref 70–99)
Potassium: 3.4 mmol/L — ABNORMAL LOW (ref 3.5–5.1)
Sodium: 139 mmol/L (ref 135–145)

## 2024-01-05 LAB — TROPONIN I (HIGH SENSITIVITY)
Troponin I (High Sensitivity): 6 ng/L (ref <18)
Troponin I (High Sensitivity): 5 ng/L (ref <18)

## 2024-01-05 MED ORDER — ASPIRIN 81 MG PO CHEW
324.0000 mg | CHEWABLE_TABLET | Freq: Once | ORAL | Status: AC
  Administered 2024-01-05: 324 mg via ORAL
  Filled 2024-01-05: qty 4

## 2024-01-05 MED ORDER — ACETAMINOPHEN 500 MG PO TABS
1000.0000 mg | ORAL_TABLET | Freq: Once | ORAL | Status: AC
  Administered 2024-01-05: 1000 mg via ORAL
  Filled 2024-01-05: qty 2

## 2024-01-05 MED ORDER — NITROGLYCERIN 0.4 MG SL SUBL
0.4000 mg | SUBLINGUAL_TABLET | SUBLINGUAL | Status: DC | PRN
  Administered 2024-01-05 (×3): 0.4 mg via SUBLINGUAL
  Filled 2024-01-05: qty 1

## 2024-01-05 MED ORDER — IOHEXOL 350 MG/ML SOLN
100.0000 mL | Freq: Once | INTRAVENOUS | Status: AC | PRN
  Administered 2024-01-05: 100 mL via INTRAVENOUS

## 2024-01-05 MED ORDER — ISOSORBIDE MONONITRATE ER 30 MG PO TB24: 30.0000 mg | ORAL_TABLET | Freq: Every day | ORAL | 0 refills | Status: DC

## 2024-01-05 MED ORDER — NITROGLYCERIN 2 % TD OINT
0.5000 [in_us] | TOPICAL_OINTMENT | TRANSDERMAL | Status: AC
  Administered 2024-01-05: 0.5 [in_us] via TOPICAL
  Filled 2024-01-05: qty 1

## 2024-01-05 NOTE — ED Triage Notes (Signed)
 Patient states he woke up this morning diaphoretic and with chest pain.

## 2024-01-05 NOTE — ED Provider Notes (Signed)
 [facility] Provider Note    Event Date/Time   First MD Initiated Contact with Patient 01/05/24 0818     (approximate)   History   Chest Pain   HPI  Trevor Franklin is a 53 y.o. male history of recent ureteral colic as well as A-fib, hypertension hyperlipidemia  Woke this morning at about 6 AM with heavy chest pressure.  Sweating.  Reports it radiates slightly to his left arm.  It is slightly eased off now but still having a moderate pressure in the chest.  No nausea or vomiting noted shortness of breath.  No abdominal pain although issues he had with his kidney stones been resolved  No fevers or chills no recent illness was getting ready to go to work this morning when it started     Physical Exam   Triage Vital Signs: ED Triage Vitals  Encounter Vitals Group     BP --      Girls Systolic BP Percentile --      Girls Diastolic BP Percentile --      Boys Systolic BP Percentile --      Boys Diastolic BP Percentile --      Pulse --      Resp --      Temp --      Temp src --      SpO2 --      Weight 01/05/24 0815 195 lb (88.5 kg)     Height 01/05/24 0815 6' 1 (1.854 m)     Head Circumference --      Peak Flow --      Pain Score 01/05/24 0814 5     Pain Loc --      Pain Education --      Exclude from Growth Chart --     Most recent vital signs: Vitals:   01/05/24 1330 01/05/24 1400  BP: (!) 122/94 117/79  Pulse:    Resp: 19 16  Temp:    SpO2: 91% 94%     General: Awake, no distress.  CV:  Good peripheral perfusion.  Normal tones and rate Resp:  Normal effort.  Clear bilateral Abd:  No distention.  Soft nontender noticed Other:  No edema    Patient reports he takes Eliquis  took this morning.  Takes diltiazem  he took that last night.  Follows with Dr. Carnella   ED Results / Procedures / Treatments   Labs (all labs ordered are listed, but only abnormal results are displayed) Labs Reviewed  BASIC METABOLIC PANEL WITH GFR -  Abnormal; Notable for the following components:      Result Value   Potassium 3.4 (*)    Glucose, Bld 143 (*)    Calcium 8.5 (*)    All other components within normal limits  RESP PANEL BY RT-PCR (RSV, FLU A&B, COVID)  RVPGX2  CBC  TROPONIN I (HIGH SENSITIVITY)  TROPONIN I (HIGH SENSITIVITY)     EKG  And inter by me at 825 heart rate 70 QRS 90 QTc 438 Normal sinus rhythm, mild nonspecific ST segment abnormality, slight almost biphasic appearance in the lateral precordial leads versus very slight artifact, less than 1 mm of ST elevation noted in V2.  Not diagnostic of STEMI.  Cannot exclude myocardial ischemia based on this ECG    RADIOLOGY  CT Angio Chest Aorta W and/or Wo Contrast Result Date: 01/05/2024 CLINICAL DATA:  Chest pain, radiating to the neck and upper back, hypertension EXAM: CT  ANGIOGRAPHY CHEST WITH CONTRAST TECHNIQUE: Multidetector CT imaging of the chest was performed using the standard protocol during bolus administration of intravenous contrast. Multiplanar CT image reconstructions and MIPs were obtained to evaluate the vascular anatomy. RADIATION DOSE REDUCTION: This exam was performed according to the departmental dose-optimization program which includes automated exposure control, adjustment of the mA and/or kV according to patient size and/or use of iterative reconstruction technique. CONTRAST:  OMNIPAQUE  IOHEXOL  350 MG/ML SOLN COMPARISON:  Same day chest radiograph and prior studies FINDINGS: Cardiovascular: No aortic intramural hemotoma. Preferential opacification of the thoracic aorta. No evidence of thoracic aortic aneurysm or dissection. Normal heart size. No pericardial effusion. Mediastinum/Nodes: No lymphadenopathy. Lungs/Pleura: Similar appearance of biapical scarring. Likely bibasilar subsegmental atelectasis. No pleural effusion or pneumothorax. Upper Abdomen: No acute findings. Musculoskeletal: No acute osseous findings. Review of the MIP images confirms  the above findings. IMPRESSION: 1. No evidence of aortic dissection. No acute intrathoracic pathology identified. Electronically Signed   By: Michaeline Blanch M.D.   On: 01/05/2024 11:51   DG Chest Portable 1 View Result Date: 01/05/2024 CLINICAL DATA:  Diaphoresis and chest pain. EXAM: PORTABLE CHEST 1 VIEW COMPARISON:  11/07/2021 and CT chest 06/17/2019. FINDINGS: Trachea is midline. Heart size normal. Very minimal interstitial prominence in the lung bases. There may be trace left pleural fluid. IMPRESSION: 1. Very mild basilar interstitial prominence may be due to atelectasis or a viral/atypical pneumonia. Edema is less likely in the setting normal heart size. 2. Probable trace left pleural effusion. Electronically Signed   By: Newell Eke M.D.   On: 01/05/2024 09:33    CT chest interpreted negative for acute  Chest x-ray initially interpreted as interstitial prominence.  CT imaging however shows clear lung fields with slight atelectasis   PROCEDURES:  Critical Care performed: No  Procedures   MEDICATIONS ORDERED IN ED: Medications  nitroGLYCERIN (NITROSTAT) SL tablet 0.4 mg (0.4 mg Sublingual Given 01/05/24 0901)  aspirin  chewable tablet 324 mg (324 mg Oral Given 01/05/24 0844)  nitroGLYCERIN (NITROGLYN) 2 % ointment 0.5 inch (0.5 inches Topical Not Given 01/05/24 1318)  acetaminophen  (TYLENOL ) tablet 1,000 mg (1,000 mg Oral Given 01/05/24 1028)  iohexol  (OMNIPAQUE ) 350 MG/ML injection 100 mL (100 mLs Intravenous Contrast Given 01/05/24 1104)     IMPRESSION / MDM / ASSESSMENT AND PLAN / ED COURSE  I reviewed the triage vital signs and the nursing notes.                              Differential diagnosis includes, but is not limited to, ACS, aortic dissection, pulmonary embolism, cardiac tamponade, pneumothorax, pneumonia, pericarditis, myocarditis, GI-related causes including esophagitis/gastritis, and musculoskeletal chest wall pain.    I am most suspicious for concerns of ACS  based on his history of heavy chest tightness rating to left arm.  He does also report however some radiation towards upper back and neck, I do wish to exclude dissection aortic aneurysm etc. as well.  Will repeat ECG give aspirin .  He is already had his anticoagulant this morning.  Nitrates.  CTA pending  Patient's presentation is most consistent with acute presentation with potential threat to life or bodily function.   The patient is on the cardiac monitor to evaluate for evidence of arrhythmia and/or significant heart rate changes.   Clinical Course as of 01/05/24 1453  Wed Jan 05, 2024  1006 Patient pain and symptom-free after 3 nitroglycerin tablets.  Will place Nitropaste  on test chest now.  Awaiting further workup including CTA and troponin [MQ]  1234 CT angio negative for dissection.  First troponin normal.  Currently pain-free with Nitropaste.  Have messaged his cardiology team awaiting response (Dr. Carnella)  [MQ]  1314 Cardiology team Dr. Carnella, advising patient had a reassuring left heart cath authorization recently.  Patient follows with [facility] cardiology.  The recommendation is that the patient may be discharged with close outpatient follow-up.  Does recommend starting Imdur 50 mg daily. [MQ]    Clinical Course User Index [MQ] Dicky Anes, MD   Patient pain and symptom-free.  Feels improved no further chest pain or discomfort.  Will place on Imdur, discussed with patient.  We discussed very careful return precautions.  Cardiology clinic to schedule close follow-up, very careful chest pain precautions discussed with the patient who is in agreement  Return precautions and treatment recommendations and follow-up discussed with the patient who is agreeable with the plan.   FINAL CLINICAL IMPRESSION(S) / ED DIAGNOSES   Final diagnoses:  Chest pain, moderate coronary artery risk     Rx / DC Orders   ED Discharge Orders          Ordered    Ambulatory referral to  Cardiology       Comments: ER follow-up   01/05/24 1451    isosorbide mononitrate (IMDUR) 30 MG 24 hr tablet  Daily        01/05/24 1451             Note:  This document was prepared using Dragon voice recognition software and may include unintentional dictation errors.   Dicky Anes, MD 01/05/24 1538

## 2024-01-05 NOTE — Discharge Instructions (Addendum)
 You have been seen in the Emergency Department (ED) today for chest pain.  As we have discussed todays test results are normal, but you may require further testing.  Please follow up with the recommended doctor as instructed above in these documents regarding todays emergent visit and your recent symptoms to discuss further management.  Continue to take your regular medications. If you are not doing so already, please also take a daily baby aspirin (81 mg), at least until you follow up with your doctor.  Return to the Emergency Department (ED) if you experience any further chest pain/pressure/tightness, difficulty breathing, or sudden sweating, or other symptoms that concern you.

## 2024-01-06 ENCOUNTER — Observation Stay (HOSPITAL_COMMUNITY)
Admission: EM | Admit: 2024-01-06 | Discharge: 2024-01-07 | Disposition: A | Discharge status: Home / Self Care | Attending: Family Medicine | Admitting: Family Medicine

## 2024-01-06 ENCOUNTER — Emergency Department (HOSPITAL_COMMUNITY)

## 2024-01-06 ENCOUNTER — Other Ambulatory Visit: Payer: Self-pay

## 2024-01-06 ENCOUNTER — Encounter (HOSPITAL_COMMUNITY): Payer: Self-pay | Admitting: *Deleted

## 2024-01-06 DIAGNOSIS — I10 Essential (primary) hypertension: Secondary | ICD-10-CM | POA: Diagnosis present

## 2024-01-06 DIAGNOSIS — E876 Hypokalemia: Secondary | ICD-10-CM | POA: Diagnosis present

## 2024-01-06 DIAGNOSIS — Z7901 Long term (current) use of anticoagulants: Secondary | ICD-10-CM

## 2024-01-06 DIAGNOSIS — K219 Gastro-esophageal reflux disease without esophagitis: Secondary | ICD-10-CM | POA: Diagnosis not present

## 2024-01-06 DIAGNOSIS — Z8679 Personal history of other diseases of the circulatory system: Secondary | ICD-10-CM

## 2024-01-06 DIAGNOSIS — E785 Hyperlipidemia, unspecified: Secondary | ICD-10-CM | POA: Diagnosis not present

## 2024-01-06 DIAGNOSIS — I25110 Atherosclerotic heart disease of native coronary artery with unstable angina pectoris: Principal | ICD-10-CM | POA: Diagnosis present

## 2024-01-06 DIAGNOSIS — F411 Generalized anxiety disorder: Secondary | ICD-10-CM | POA: Diagnosis present

## 2024-01-06 DIAGNOSIS — I200 Unstable angina: Secondary | ICD-10-CM | POA: Diagnosis not present

## 2024-01-06 DIAGNOSIS — G629 Polyneuropathy, unspecified: Secondary | ICD-10-CM | POA: Diagnosis not present

## 2024-01-06 DIAGNOSIS — Z79899 Other long term (current) drug therapy: Secondary | ICD-10-CM | POA: Diagnosis not present

## 2024-01-06 DIAGNOSIS — F419 Anxiety disorder, unspecified: Secondary | ICD-10-CM | POA: Diagnosis not present

## 2024-01-06 DIAGNOSIS — Z87442 Personal history of urinary calculi: Secondary | ICD-10-CM

## 2024-01-06 DIAGNOSIS — F17210 Nicotine dependence, cigarettes, uncomplicated: Secondary | ICD-10-CM | POA: Diagnosis present

## 2024-01-06 DIAGNOSIS — R079 Chest pain, unspecified: Principal | ICD-10-CM

## 2024-01-06 DIAGNOSIS — Z880 Allergy status to penicillin: Secondary | ICD-10-CM

## 2024-01-06 DIAGNOSIS — I480 Paroxysmal atrial fibrillation: Secondary | ICD-10-CM | POA: Diagnosis not present

## 2024-01-06 DIAGNOSIS — Z7982 Long term (current) use of aspirin: Secondary | ICD-10-CM

## 2024-01-06 DIAGNOSIS — Z8249 Family history of ischemic heart disease and other diseases of the circulatory system: Secondary | ICD-10-CM

## 2024-01-06 DIAGNOSIS — I444 Left anterior fascicular block: Secondary | ICD-10-CM | POA: Diagnosis present

## 2024-01-06 LAB — BASIC METABOLIC PANEL WITH GFR
Anion gap: 12 (ref 5–15)
BUN: 22 mg/dL — ABNORMAL HIGH (ref 6–20)
CO2: 24 mmol/L (ref 22–32)
Calcium: 8.9 mg/dL (ref 8.9–10.3)
Chloride: 104 mmol/L (ref 98–111)
Creatinine, Ser: 0.83 mg/dL (ref 0.61–1.24)
GFR, Estimated: >60 mL/min (ref >60)
Glucose, Bld: 145 mg/dL — ABNORMAL HIGH (ref 70–99)
Potassium: 3.6 mmol/L (ref 3.5–5.1)
Sodium: 140 mmol/L (ref 135–145)

## 2024-01-06 LAB — CBC
HCT: 45.2 % (ref 39.0–52.0)
Hemoglobin: 16.1 g/dL (ref 13.0–17.0)
MCH: 31 pg (ref 26.0–34.0)
MCHC: 35.6 g/dL (ref 30.0–36.0)
MCV: 86.9 fL (ref 80.0–100.0)
Platelets: 205 K/uL (ref 150–400)
RBC: 5.2 MIL/uL (ref 4.22–5.81)
RDW: 12.8 % (ref 11.5–15.5)
WBC: 11.2 K/uL — ABNORMAL HIGH (ref 4.0–10.5)
nRBC: 0 % (ref 0.0–0.2)

## 2024-01-06 LAB — TROPONIN I (HIGH SENSITIVITY): Troponin I (High Sensitivity): 7 ng/L (ref <18)

## 2024-01-06 MED ORDER — NITROGLYCERIN 2 % TD OINT
1.0000 [in_us] | TOPICAL_OINTMENT | Freq: Once | TRANSDERMAL | Status: AC
  Administered 2024-01-06: 1 [in_us] via TOPICAL
  Filled 2024-01-06: qty 1

## 2024-01-06 MED ORDER — MORPHINE SULFATE (PF) 4 MG/ML IV SOLN
4.0000 mg | Freq: Once | INTRAVENOUS | Status: AC
  Administered 2024-01-06: 4 mg via INTRAVENOUS
  Filled 2024-01-06: qty 1

## 2024-01-06 NOTE — ED Provider Notes (Signed)
 [place] EMERGENCY DEPARTMENT AT [facility] Provider Note   CSN: [id number] Arrival date & time: 01/06/24  2130     Patient presents with: Chest Pain   Trevor Franklin is a 53 y.o. male patient who presents to the emergency department today for further evaluation of hyperlipidemia, hypertension, atrial fibrillation on Eliquis  who presents to the emergency department today for further evaluation of chest heaviness which started yesterday.  Patient was seen and evaluated at [facility] yesterday and per patient and wife at the bedside they wanted to admit him but they spoke with cardiology who decided against it.  Patient had a normal heart cath 2 years ago apparently.  During the patient's evaluation yesterday he received 3 sublingual nitros which completely resolved his chest pain.  Today, patient is now having pain going down the left arm and some mild shortness of breath which prompted his arrival today.  Pain is still present and worse with movement and better with rest.  He denies any fever, chills but does endorse some diaphoresis.  No abdominal pain, nausea, vomit, diarrhea.  {Add pertinent medical, surgical, social history, OB history to HPI:32947}  Chest Pain      Prior to Admission medications   Medication Sig Start Date End Date Taking? Authorizing Provider  apixaban  (ELIQUIS ) 5 MG TABS tablet Take 1 tablet (5 mg total) by mouth 2 (two) times daily. 08/02/18   Edelmiro Leash, MD  atorvastatin (LIPITOR) 40 MG tablet Take 40 mg by mouth every evening. 01/04/14   [provider]  diltiazem  (TIAZAC ) 240 MG 24 hr capsule Take 240 mg by mouth daily. 01/04/14   [provider]  isosorbide mononitrate (IMDUR) 30 MG 24 hr tablet Take 1 tablet (30 mg total) by mouth daily. 01/05/24   Dicky Anes, MD  omeprazole (PRILOSEC) 20 MG capsule Take 20 mg by mouth daily. 01/04/14   [provider]  ondansetron  (ZOFRAN  ODT) 4 MG disintegrating tablet Take 1 tablet (4  mg total) by mouth every 8 (eight) hours as needed for nausea or vomiting. 12/08/23   [name], MD  oxyCODONE  (ROXICODONE ) 5 MG immediate release tablet Take 1 tablet (5 mg total) by mouth every 8 (eight) hours as needed. 12/08/23 12/07/24  Tan, Ting Xu, MD  POTASSIUM PO Take 595 mg by mouth daily.     [provider]  sucralfate  (CARAFATE ) 1 GM/10ML suspension Take 10 mLs (1 g total) by mouth 4 (four) times daily. 09/20/19 09/19/20  Vila Lauraine CROME., MD  tamsulosin  (FLOMAX ) 0.4 MG CAPS capsule Take 1 capsule (0.4 mg total) by mouth daily. 12/08/23   [name], MD    Allergies: Penicillins    Review of Systems  Cardiovascular:  Positive for chest pain.  All other systems reviewed and are negative.   Updated Vital Signs BP (!) 160/103 (BP Location: Right Arm)   Pulse 96   Temp 98.6 F (37 C)   Resp (!) 28   SpO2 100%   Physical Exam Vitals and nursing note reviewed.  Constitutional:      General: He is not in acute distress.    Appearance: Normal appearance.  HENT:     Head: Normocephalic and atraumatic.  Eyes:     General:        Right eye: No discharge.        Left eye: No discharge.  Cardiovascular:     Comments: Regular rate and rhythm.  S1/S2 are distinct without any evidence of murmur,  rubs, or gallops.  Radial pulses are 2+ bilaterally.  Dorsalis pedis pulses are 2+ bilaterally.  No evidence of pedal edema. Pulmonary:     Comments: Clear to auscultation bilaterally.  Normal effort.  No respiratory distress.  No evidence of wheezes, rales, or rhonchi heard throughout. Chest:     Comments: Mild reproducible pain with palpation of the anterior chest wall. Abdominal:     General: Abdomen is flat. Bowel sounds are normal. There is no distension.     Tenderness: There is no abdominal tenderness. There is no guarding or rebound.  Musculoskeletal:        General: Normal range of motion.     Cervical back: Neck supple.  Skin:    General: Skin is warm and dry.      Findings: No rash.  Neurological:     General: No focal deficit present.     Mental Status: He is alert.  Psychiatric:        Mood and Affect: Mood normal.        Behavior: Behavior normal.     (all labs ordered are listed, but only abnormal results are displayed) Labs Reviewed  BASIC METABOLIC PANEL WITH GFR - Abnormal; Notable for the following components:      Result Value   Glucose, Bld 145 (*)    BUN 22 (*)    All other components within normal limits  CBC - Abnormal; Notable for the following components:   WBC 11.2 (*)    All other components within normal limits  TROPONIN I (HIGH SENSITIVITY)    EKG: EKG Interpretation Date/Time:  Thursday January 06 2024 21:33:57 EDT Ventricular Rate:  95 PR Interval:  176 QRS Duration:  96 QT Interval:  380 QTC Calculation: 477 R Axis:   -58  Text Interpretation: Normal sinus rhythm Left anterior fascicular block Left ventricular hypertrophy ( R in aVL , Cornell product , Romhilt-Estes ) Abnormal ECG Confirmed by Francesca Fallow ([id number]) on 01/06/2024 10:15:07 PM  Radiology: [facility] Chest Port 1 View Result Date: 01/06/2024 CLINICAL DATA:  cp EXAM: PORTABLE CHEST 1 VIEW COMPARISON:  Chest x-ray 01/05/2024 FINDINGS: The heart and mediastinal contours are within normal limits. No focal consolidation. No pulmonary edema. No pleural effusion. No pneumothorax. No acute osseous abnormality. IMPRESSION: No active disease. Electronically Signed   By: Morgane  Naveau M.D.   On: 01/06/2024 21:52   CT Angio Chest Aorta W and/or Wo Contrast Result Date: 01/05/2024 CLINICAL DATA:  Chest pain, radiating to the neck and upper back, hypertension EXAM: CT ANGIOGRAPHY CHEST WITH CONTRAST TECHNIQUE: Multidetector CT imaging of the chest was performed using the standard protocol during bolus administration of intravenous contrast. Multiplanar CT image reconstructions and MIPs were obtained to evaluate the vascular anatomy. RADIATION DOSE REDUCTION: This exam  was performed according to the departmental dose-optimization program which includes automated exposure control, adjustment of the mA and/or kV according to patient size and/or use of iterative reconstruction technique. CONTRAST:  OMNIPAQUE  IOHEXOL  350 MG/ML SOLN COMPARISON:  Same day chest radiograph and prior studies FINDINGS: Cardiovascular: No aortic intramural hemotoma. Preferential opacification of the thoracic aorta. No evidence of thoracic aortic aneurysm or dissection. Normal heart size. No pericardial effusion. Mediastinum/Nodes: No lymphadenopathy. Lungs/Pleura: Similar appearance of biapical scarring. Likely bibasilar subsegmental atelectasis. No pleural effusion or pneumothorax. Upper Abdomen: No acute findings. Musculoskeletal: No acute osseous findings. Review of the MIP images confirms the above findings. IMPRESSION: 1. No evidence of aortic dissection. No acute intrathoracic pathology identified. Electronically  Signed   By: Michaeline Blanch M.D.   On: 01/05/2024 11:51   DG Chest Portable 1 View Result Date: 01/05/2024 CLINICAL DATA:  Diaphoresis and chest pain. EXAM: PORTABLE CHEST 1 VIEW COMPARISON:  11/07/2021 and CT chest 06/17/2019. FINDINGS: Trachea is midline. Heart size normal. Very minimal interstitial prominence in the lung bases. There may be trace left pleural fluid. IMPRESSION: 1. Very mild basilar interstitial prominence may be due to atelectasis or a viral/atypical pneumonia. Edema is less likely in the setting normal heart size. 2. Probable trace left pleural effusion. Electronically Signed   By: Newell Eke M.D.   On: 01/05/2024 09:33    {Document cardiac monitor, telemetry assessment procedure when appropriate:32947} Procedures   Medications Ordered in the ED  nitroGLYCERIN (NITROGLYN) 2 % ointment 1 inch (has no administration in time range)      {Click here for ABCD2, HEART and other calculators REFRESH Note before signing:1}                               Medical Decision Making Risk Prescription drug management.   ***  {Document critical care time when appropriate  Document review of labs and clinical decision tools ie [id number], etc  Document your independent review of radiology images and any outside records  Document your discussion with family members, caretakers and with consultants  Document social determinants of health affecting pt's care  Document your decision making why or why not admission, treatments were needed:32947:::1}   Final diagnoses:  None    ED Discharge Orders     None

## 2024-01-06 NOTE — ED Triage Notes (Signed)
 Pt having left sided chest pain with sob worsening tonight. Was seen at [facility] for same and was discharged home. Pt having worsen chest pain tonight with increased sob and radiation into left arm which was the most worrisome. Pt appears uncomfortable, noted to be hypertensive

## 2024-01-07 ENCOUNTER — Inpatient Hospital Stay (HOSPITAL_COMMUNITY)

## 2024-01-07 ENCOUNTER — Other Ambulatory Visit: Payer: Self-pay | Admitting: Student in an Organized Health Care Education/Training Program

## 2024-01-07 ENCOUNTER — Inpatient Hospital Stay (HOSPITAL_BASED_OUTPATIENT_CLINIC_OR_DEPARTMENT_OTHER)
Admit: 2024-01-07 | Discharge: 2024-01-07 | Disposition: A | Discharge status: Home / Self Care | Attending: Student in an Organized Health Care Education/Training Program | Admitting: Student in an Organized Health Care Education/Training Program

## 2024-01-07 ENCOUNTER — Encounter (HOSPITAL_COMMUNITY): Payer: Self-pay | Admitting: Internal Medicine

## 2024-01-07 DIAGNOSIS — K2100 Gastro-esophageal reflux disease with esophagitis, without bleeding: Secondary | ICD-10-CM

## 2024-01-07 DIAGNOSIS — I200 Unstable angina: Secondary | ICD-10-CM

## 2024-01-07 DIAGNOSIS — I2510 Atherosclerotic heart disease of native coronary artery without angina pectoris: Secondary | ICD-10-CM

## 2024-01-07 DIAGNOSIS — Z8679 Personal history of other diseases of the circulatory system: Secondary | ICD-10-CM | POA: Diagnosis not present

## 2024-01-07 DIAGNOSIS — R931 Abnormal findings on diagnostic imaging of heart and coronary circulation: Secondary | ICD-10-CM | POA: Diagnosis not present

## 2024-01-07 DIAGNOSIS — I480 Paroxysmal atrial fibrillation: Secondary | ICD-10-CM

## 2024-01-07 DIAGNOSIS — F411 Generalized anxiety disorder: Secondary | ICD-10-CM | POA: Insufficient documentation

## 2024-01-07 DIAGNOSIS — I10 Essential (primary) hypertension: Secondary | ICD-10-CM

## 2024-01-07 DIAGNOSIS — E7849 Other hyperlipidemia: Secondary | ICD-10-CM

## 2024-01-07 DIAGNOSIS — R072 Precordial pain: Secondary | ICD-10-CM | POA: Diagnosis not present

## 2024-01-07 DIAGNOSIS — K219 Gastro-esophageal reflux disease without esophagitis: Secondary | ICD-10-CM | POA: Insufficient documentation

## 2024-01-07 DIAGNOSIS — G629 Polyneuropathy, unspecified: Secondary | ICD-10-CM

## 2024-01-07 LAB — COMPREHENSIVE METABOLIC PANEL WITH GFR
ALT: 19 U/L (ref 0–44)
AST: 20 U/L (ref 15–41)
Albumin: 3.3 g/dL — ABNORMAL LOW (ref 3.5–5.0)
Alkaline Phosphatase: 99 U/L (ref 38–126)
Anion gap: 11 (ref 5–15)
BUN: 19 mg/dL (ref 6–20)
CO2: 23 mmol/L (ref 22–32)
Calcium: 8.6 mg/dL — ABNORMAL LOW (ref 8.9–10.3)
Chloride: 105 mmol/L (ref 98–111)
Creatinine, Ser: 0.72 mg/dL (ref 0.61–1.24)
GFR, Estimated: >60 mL/min (ref >60)
Glucose, Bld: 121 mg/dL — ABNORMAL HIGH (ref 70–99)
Potassium: 3.2 mmol/L — ABNORMAL LOW (ref 3.5–5.1)
Sodium: 139 mmol/L (ref 135–145)
Total Bilirubin: 0.5 mg/dL (ref 0.0–1.2)
Total Protein: 6.2 g/dL — ABNORMAL LOW (ref 6.5–8.1)

## 2024-01-07 LAB — CBC
HCT: 44 % (ref 39.0–52.0)
Hemoglobin: 15.1 g/dL (ref 13.0–17.0)
MCH: 30.2 pg (ref 26.0–34.0)
MCHC: 34.3 g/dL (ref 30.0–36.0)
MCV: 88 fL (ref 80.0–100.0)
Platelets: 182 K/uL (ref 150–400)
RBC: 5 MIL/uL (ref 4.22–5.81)
RDW: 13.1 % (ref 11.5–15.5)
WBC: 12.8 K/uL — ABNORMAL HIGH (ref 4.0–10.5)
nRBC: 0 % (ref 0.0–0.2)

## 2024-01-07 LAB — ECHOCARDIOGRAM COMPLETE
Area-P 1/2: 2.55 cm2
Calc EF: 62.6 %
Height: 73 in
S' Lateral: 2.3 cm
Single Plane A2C EF: 58.3 %
Single Plane A4C EF: 64 %
Weight: 3120 [oz_av]

## 2024-01-07 LAB — LIPID PANEL
Cholesterol: 161 mg/dL (ref 0–200)
HDL: 25 mg/dL — ABNORMAL LOW (ref >40)
LDL Cholesterol: 118 mg/dL — ABNORMAL HIGH (ref 0–99)
Total CHOL/HDL Ratio: 6.4 ratio
Triglycerides: 92 mg/dL (ref <150)
VLDL: 18 mg/dL (ref 0–40)

## 2024-01-07 LAB — TROPONIN I (HIGH SENSITIVITY): Troponin I (High Sensitivity): 7 ng/L (ref <18)

## 2024-01-07 LAB — HIV ANTIBODY (ROUTINE TESTING W REFLEX): HIV Screen 4th Generation wRfx: NONREACTIVE

## 2024-01-07 MED ORDER — ATORVASTATIN CALCIUM 40 MG PO TABS: 40.0000 mg | ORAL_TABLET | Freq: Every evening | ORAL | Status: DC

## 2024-01-07 MED ORDER — PANTOPRAZOLE SODIUM 40 MG PO TBEC
40.0000 mg | DELAYED_RELEASE_TABLET | Freq: Every day | ORAL | Status: DC
  Administered 2024-01-07: 40 mg via ORAL
  Filled 2024-01-07: qty 1

## 2024-01-07 MED ORDER — METOPROLOL TARTRATE 5 MG/5ML IV SOLN
INTRAVENOUS | Status: AC
  Filled 2024-01-07: qty 10

## 2024-01-07 MED ORDER — ASPIRIN 81 MG PO TBEC: 81.0000 mg | DELAYED_RELEASE_TABLET | Freq: Every day | ORAL | Status: DC

## 2024-01-07 MED ORDER — SODIUM CHLORIDE 0.9% FLUSH
3.0000 mL | Freq: Two times a day (BID) | INTRAVENOUS | Status: DC
  Administered 2024-01-07: 3 mL via INTRAVENOUS

## 2024-01-07 MED ORDER — POTASSIUM CHLORIDE CRYS ER 20 MEQ PO TBCR
20.0000 meq | EXTENDED_RELEASE_TABLET | Freq: Once | ORAL | Status: AC
  Administered 2024-01-07: 20 meq via ORAL
  Filled 2024-01-07: qty 1

## 2024-01-07 MED ORDER — APIXABAN 5 MG PO TABS
5.0000 mg | ORAL_TABLET | Freq: Two times a day (BID) | ORAL | Status: DC
  Administered 2024-01-07: 5 mg via ORAL
  Filled 2024-01-07: qty 1

## 2024-01-07 MED ORDER — SODIUM CHLORIDE 0.9% FLUSH: 3.0000 mL | INTRAVENOUS | Status: DC | PRN

## 2024-01-07 MED ORDER — NITROGLYCERIN 0.4 MG SL SUBL
SUBLINGUAL_TABLET | SUBLINGUAL | Status: AC
  Filled 2024-01-07: qty 2

## 2024-01-07 MED ORDER — ONDANSETRON HCL 4 MG/2ML IJ SOLN: 4.0000 mg | Freq: Four times a day (QID) | INTRAMUSCULAR | Status: DC | PRN

## 2024-01-07 MED ORDER — ISOSORBIDE MONONITRATE ER 60 MG PO TB24: 60.0000 mg | ORAL_TABLET | Freq: Every day | ORAL | 1 refills | Status: DC

## 2024-01-07 MED ORDER — ESCITALOPRAM OXALATE 10 MG PO TABS
10.0000 mg | ORAL_TABLET | Freq: Every day | ORAL | Status: DC
  Administered 2024-01-07: 10 mg via ORAL
  Filled 2024-01-07: qty 1

## 2024-01-07 MED ORDER — NITROGLYCERIN 0.4 MG SL SUBL
0.8000 mg | SUBLINGUAL_TABLET | Freq: Once | SUBLINGUAL | Status: AC
  Administered 2024-01-07: 0.8 mg via SUBLINGUAL

## 2024-01-07 MED ORDER — METOPROLOL SUCCINATE ER 50 MG PO TB24: 50.0000 mg | ORAL_TABLET | Freq: Every day | ORAL | Status: DC

## 2024-01-07 MED ORDER — SODIUM CHLORIDE 0.9 % IV SOLN: 250.0000 mL | INTRAVENOUS | Status: DC | PRN

## 2024-01-07 MED ORDER — ROSUVASTATIN CALCIUM 20 MG PO TABS
40.0000 mg | ORAL_TABLET | Freq: Every day | ORAL | Status: DC
  Administered 2024-01-07: 40 mg via ORAL
  Filled 2024-01-07: qty 2

## 2024-01-07 MED ORDER — ONDANSETRON HCL 4 MG PO TABS: 4.0000 mg | ORAL_TABLET | Freq: Four times a day (QID) | ORAL | Status: DC | PRN

## 2024-01-07 MED ORDER — IOHEXOL 350 MG/ML SOLN
100.0000 mL | Freq: Once | INTRAVENOUS | Status: AC | PRN
Start: 2024-01-07 — End: 2024-01-07
  Administered 2024-01-07: 100 mL via INTRAVENOUS

## 2024-01-07 MED ORDER — METOPROLOL SUCCINATE ER 100 MG PO TB24
100.0000 mg | ORAL_TABLET | Freq: Every day | ORAL | Status: DC
  Administered 2024-01-07: 100 mg via ORAL
  Filled 2024-01-07: qty 4

## 2024-01-07 MED ORDER — POTASSIUM CHLORIDE ER 10 MEQ PO TBCR
40.0000 meq | EXTENDED_RELEASE_TABLET | Freq: Once | ORAL | Status: AC
  Administered 2024-01-07: 40 meq via ORAL
  Filled 2024-01-07: qty 4

## 2024-01-07 MED ORDER — ACETAMINOPHEN 325 MG PO TABS
650.0000 mg | ORAL_TABLET | Freq: Four times a day (QID) | ORAL | Status: DC | PRN
  Administered 2024-01-07: 650 mg via ORAL
  Filled 2024-01-07: qty 2

## 2024-01-07 MED ORDER — ACETAMINOPHEN 650 MG RE SUPP: 650.0000 mg | Freq: Four times a day (QID) | RECTAL | Status: DC | PRN

## 2024-01-07 MED ORDER — ISOSORBIDE MONONITRATE ER 60 MG PO TB24
60.0000 mg | ORAL_TABLET | Freq: Every day | ORAL | Status: DC
  Administered 2024-01-07: 60 mg via ORAL
  Filled 2024-01-07: qty 2

## 2024-01-07 NOTE — Discharge Summary (Signed)
 Physician Discharge Summary   Patient: Trevor Franklin MRN: [id number] DOB: 1970-04-30  Admit date:     01/06/2024  Discharge date: 01/07/24  Discharge Physician: Sabas GORMAN Brod   PCP: Pcp, No   Recommendations at discharge:   Follow-up cardiology on Monday Check potassium level on Monday  Discharge Diagnoses: Principal Problem:   Unstable angina (HCC) Active Problems:   Paroxysmal atrial fibrillation (HCC)   Hyperlipidemia   History of CAD (coronary artery disease)   Essential hypertension   GERD (gastroesophageal reflux disease)   Generalized anxiety disorder   Peripheral neuropathy  Resolved Problems:   * No resolved hospital problems. *  Hospital Course: 53 y.o. male with medical history significant of paroxysmal atrial fibrillation, CAD, essential hypertension, GERD, hyperlipidemia, generalized anxiety disorder and peripheral neuropathy presented to emergency department complaining for chest pain and chest heaviness that has been started for last 24 hours.  Patient has been seen at [facility] emergency department and per patient and wife at the bedside they wanted to admit hospital however they spoke with cardiologist who evaluated and recommended to discharge to home given patient has recent heart catheterization which is reassuring and as patient has flat troponin.  In the ED at [facility] cardiology recommended to follow-up with patient's cardiologist at [facility] and recommended to start Imdur.   During my evaluation at the bedside patient reported mid substernal constant chest pain and when he moves it rotates/radiates to further left and radiates to his arm.  Patient denies any palpitation or shortness of breath. Patient is very anxious regarding persistent chest pain.  Assessment and Plan:  nstable angina History of CAD -Presented emergency department complaining of persistent precordial chest pain with radiation to the left sided arm with tingling sensation.  Patient initially went  to [facility] ED further workup revealed flat troponin and cardiology evaluated patient given patient has previous nonsignificant CAD recommended medical management and recommended to start Imdur 50 mg daily.  During that time patient has been loaded with aspirin  and CTA chest ruled out PE, aortic atherosclerosis and intrathoracic abnormality. - However patient continued to have chest pain.  In the ED patient has been given a topical nitroglycerin 15 mg which did not resolve the chest pain however with morphine  4 mg chest pain completely subsided.  Patient also has some component of reproducible chest wall pain as well. -Troponin x 2 within normal range. - Cardiology was consulted, CT coronary angiography obtained which was negative for significant coronary artery stenosis -Echocardiogram did not show wall motion abnormality -Patient started on Imdur 60 mg daily -Cardiology has cleared patient for discharge    Paroxysmal atrial fibrillation - Due to low blood pressure, will cut down the dose of Toprol XL to 50 mg daily.  Continue Eliquis  5 mg p.o. twice daily.     Essential hypertension -Continue Toprol-XL and Imdur   Generalized anxiety disorder -Continue Lexapro   Hypokalemia Potassium is 3.2 - Will replace potassium before discharge - Patient can get potassium level checked at cardiology office on Monday      Consultants: Cardiology Procedures performed:  Disposition: Home Diet recommendation:  Discharge Diet Orders (From admission, onward)     Start     Ordered   01/07/24 0000  Diet - low sodium heart healthy        01/07/24 1700           Regular diet DISCHARGE MEDICATION: Allergies as of 01/07/2024       Reactions   Penicillins Other (  See Comments)   Unknown from childhood        Medication List     STOP taking these medications    atorvastatin 40 MG tablet Commonly known as: LIPITOR       TAKE these medications    apixaban  5 MG Tabs tablet Commonly  known as: Eliquis  Take 1 tablet (5 mg total) by mouth 2 (two) times daily.   isosorbide mononitrate 60 MG 24 hr tablet Commonly known as: IMDUR Take 1 tablet (60 mg total) by mouth daily. Start taking on: January 08, 2024   metoprolol succinate 50 MG 24 hr tablet Commonly known as: TOPROL-XL Take 1 tablet (50 mg total) by mouth daily. What changed:  how much to take when to take this additional instructions   omeprazole 20 MG capsule Commonly known as: PRILOSEC Take 20 mg by mouth daily.   potassium chloride  10 MEQ tablet Commonly known as: KLOR-CON  Take 10 mEq by mouth 2 (two) times daily.   rosuvastatin 40 MG tablet Commonly known as: CRESTOR Take 1 tablet by mouth at bedtime.   tamsulosin  0.4 MG Caps capsule Commonly known as: FLOMAX  Take 1 capsule (0.4 mg total) by mouth daily.   Tiadylt  ER 180 MG 24 hr capsule Generic drug: diltiazem  Take 180 mg by mouth daily.        Discharge Exam: Filed Weights   01/07/24 1012  Weight: 88.5 kg   General-appears in no acute distress Heart-S1-S2, regular, no murmur auscultated Lungs-clear to auscultation bilaterally, no wheezing or crackles auscultated Abdomen-soft, nontender, no organomegaly Extremities-no edema in the lower extremities Neuro-alert, oriented x3, no focal deficit noted  Condition at discharge: good  The results of significant diagnostics from this hospitalization (including imaging, microbiology, ancillary and laboratory) are listed below for reference.   Imaging Studies: CT CORONARY MORPH W/CTA COR W/SCORE W/CA W/CM &/OR WO/CM Addendum Date: 01/07/2024 ADDENDUM REPORT: 01/07/2024 15:06 EXAM: OVER-READ INTERPRETATION  CT CHEST The following report is an over-read performed by radiologist Dr. Manford Breaker [facility] on 01/07/2024. This over-read does not include interpretation of cardiac or coronary anatomy or pathology. The coronary CTA interpretation by the cardiologist is attached.  COMPARISON:  CTA chest dated 01/05/2024 FINDINGS: Cardiovascular: Normal appearance of extracardiac vascular structures. Mediastinum/Nodes: Normal esophagus. No pathologically enlarged mediastinal or hilar lymph nodes. Lungs/Pleura: The imaged central airways are patent. Lingular and dependent bilateral lower lobe irregular consolidation. No focal consolidation. No pneumothorax. No pleural effusion. Upper abdomen: Normal. Musculoskeletal: No acute or abnormal lytic or blastic osseous lesions. Multilevel degenerative changes of the partially imaged thoracic spine. IMPRESSION: Lingular and dependent bilateral lower lobe irregular consolidation, likely atelectasis. Electronically Signed   By: Limin  Xu M.D.   On: 01/07/2024 15:06   Result Date: 01/07/2024 HISTORY: Chest pain, nonspecific EXAM: Cardiac/Coronary CT TECHNIQUE: The patient was scanned on a [employer]. PROTOCOL: A 120 kV prospective scan was triggered in the descending thoracic aorta at 111 HU's. Axial non-contrast 3 mm slices were carried out through the heart. The data set was analyzed on a dedicated work station and scored using the Agatston method. Gantry rotation speed was 250 msecs and collimation was 0.6 mm. Heart rate was optimized medically and sl NTG was given. The 3D data set was reconstructed in 5% intervals of the 35-75 % of the R-R cycle. Systolic and diastolic phases were analyzed on a dedicated work station using MPR, MIP and VRT modes. The patient received OMNIPAQUE  IOHEXOL  350 MG/ML SOLN of contrast. FINDINGS: Image  Quality: Excellent Coronary calcium score: The patient's coronary artery calcium score is 81.8, which places the patient in the 68th percentile. Coronary arteries: Normal coronary origins.  Right dominance. Left Main: Normal caliber vessel. No significant plaque or stenosis. Left Anterior Descending: Normal caliber vessel. There is scattered calcified and noncalcified plaque throughout the LAD. There is a  focal area of luminal narrowing in the mid LAD from non-calcified plaque resulting in 25-49% stenosis. Diagonal branches: There are 3 normal caliber diagonal branches. The proximal segment of D1 has focal narrowing from non-calcified plaque resulting in 25-49% stenosis. There is calcified plaque in the mid vessel with <25% stenosis. D2 and D3 are without significant plaque or stenosis. Left Circumflex Artery: Normal caliber vessel. Minimal calcified plaque without associated significant stenosis. Obtuse Marginal branches: There is 1 large caliber OM branch with minimal calcified plaque without associated stenosis. Right Coronary Artery: Normal caliber vessel, gives rise to PDA. Minimal calcified plaque resulting in <25% stenosis. EXTRA-CORONARY FINDINGS: EXTRA-CORONARY FINDINGS Aorta: Normal size, 35 x 35 mm at the mid ascending aorta (level of the PA bifurcation) measured double oblique. No aortic atherosclerosis. No dissection seen in visualized portions of the aorta. Normal size of the pulmonary artery Aortic Valve: Trileaflet without calcifications. Normal pulmonary vein drainage into the left atrium. Normal left atrial appendage without a thrombus. Normal appearance of the pericardium. Non-cardiac findings will be assessed and reported in a separate document by radiology. IMPRESSION: 1. Scattered non-calcified and calcified plaque primarily in the LAD. There is one focal area of luminal narrowing in the mid LAD from non-calcified plaque resulting in 25-49% stenosis. CADRADS = 2. CT FFR will be performed and reported on separately. 2. Coronary calcium score of 81.8. This was 68th percentile for age-, sex-, and race- matched controls. 3. Total plaque volume 75.4 mm3. 4. Normal coronary origin with right dominance. INTERPRETATION: 1. CAD-RADS 0: No evidence of CAD (0%). Consider non-atherosclerotic causes of chest pain. 2. CAD-RADS 1: Minimal non-obstructive CAD (1-24%). Consider non-atherosclerotic causes of  chest pain. Consider preventive therapy and risk factor modification. 3. CAD-RADS 2: Mild non-obstructive CAD (25-49%). Consider non-atherosclerotic causes of chest pain. Consider preventive therapy and risk factor modification. 4. CAD-RADS 3: Moderate stenosis (50-69%). Consider symptom-guided anti-ischemic pharmacotherapy as well as risk factor modification per guideline directed care. Additional analysis with CT FFR will be submitted. 5. CAD-RADS 4: Severe stenosis. (70-99% or > 50% left main). Cardiac catheterization or CT FFR is recommended. Consider symptom-guided anti-ischemic pharmacotherapy as well as risk factor modification per guideline directed care. Invasive coronary angiography recommended with revascularization per published guideline statements. 6. CAD-RADS 5: Total coronary occlusion (100%). Consider cardiac catheterization or viability assessment. Consider symptom-guided anti-ischemic pharmacotherapy as well as risk factor modification per guideline directed care. 7. CAD-RADS N: Non-diagnostic study. Obstructive CAD can't be excluded. Alternative evaluation is recommended. Electronically Signed: By: Georganna Archer On: 01/07/2024 14:49   CT CORONARY FRACTIONAL FLOW RESERVE FLUID ANALYSIS Result Date: 01/07/2024 EXAM: CT FFR ANALYSIS CLINICAL DATA:  chest pain FINDINGS: FFRct analysis was performed on the original cardiac CT angiogram dataset. Diagrammatic representation of the FFRct analysis is provided in a separate PDF document in PACS. This dictation was created using the PDF document and an interactive 3D model of the results. 3D model is not available in the EMR/PACS. Normal FFR range is >0.80. 1. LAD: No significant stenosis. Proximal FFR = 0.97 , Mid FFR = 0.88 , Distal FFR =0.85 2. LCX: No significant stenosis. Proximal FFR = 0.98, Distal FFR =  0.92 3. RCA: No significant stenosis. Proximal FFR = 0.99 , Mid FFR = 0.98, distal FFR = 0.99 IMPRESSION: 1.  CT FFR analysis did not show any  flow-limiting stenoses. Electronically Signed   By: Georganna Archer   On: 01/07/2024 14:54   ECHOCARDIOGRAM COMPLETE Result Date: 01/07/2024    ECHOCARDIOGRAM REPORT   Patient Name:   HOMAR WEINKAUF Date of Exam: 01/07/2024 Medical Rec #:  [id number]     Height:       73.0 in Accession #:    [id number]    Weight:       195.0 lb Date of Birth:  1971/01/30    BSA:          2.129 m Patient Age:    52 years      BP:           122/89 mmHg Patient Gender: M             HR:           53 bpm. Exam Location:  Inpatient Procedure: 2D Echo (Both Spectral and Color Flow Doppler were utilized during            procedure). Indications:    Angina  History:        Patient has no prior history of Echocardiogram examinations.                 Signs/Symptoms:Chest Pain.  Sonographer:    Norleen Amour Referring Phys: [id number] SUBRINA SUNDIL IMPRESSIONS  1. Left ventricular ejection fraction, by estimation, is 60 to 65%. The left ventricle has normal function. The left ventricle has no regional wall motion abnormalities. There is mild concentric left ventricular hypertrophy. Left ventricular diastolic parameters were normal.  2. Right ventricular systolic function is normal. The right ventricular size is normal.  3. The mitral valve is normal in structure. No evidence of mitral valve regurgitation. No evidence of mitral stenosis.  4. The aortic valve is grossly normal. There is mild calcification of the aortic valve. Aortic valve regurgitation is not visualized. No aortic stenosis is present. Comparison(s): No prior Echocardiogram. Conclusion(s)/Recommendation(s): Normal biventricular function without evidence of hemodynamically significant valvular heart disease. FINDINGS  Left Ventricle: Left ventricular ejection fraction, by estimation, is 60 to 65%. The left ventricle has normal function. The left ventricle has no regional wall motion abnormalities. The left ventricular internal cavity size was normal in size. There is  mild  concentric left ventricular hypertrophy. Left ventricular diastolic parameters were normal. Right Ventricle: The right ventricular size is normal. No increase in right ventricular wall thickness. Right ventricular systolic function is normal. Left Atrium: Left atrial size was normal in size. Right Atrium: Right atrial size was normal in size. Pericardium: There is no evidence of pericardial effusion. Mitral Valve: The mitral valve is normal in structure. No evidence of mitral valve regurgitation. No evidence of mitral valve stenosis. Tricuspid Valve: The tricuspid valve is not well visualized. Tricuspid valve regurgitation is trivial. No evidence of tricuspid stenosis. Aortic Valve: The aortic valve is grossly normal. There is mild calcification of the aortic valve. Aortic valve regurgitation is not visualized. No aortic stenosis is present. Pulmonic Valve: The pulmonic valve was not well visualized. Pulmonic valve regurgitation is mild. No evidence of pulmonic stenosis. Aorta: The aortic root, ascending aorta and aortic arch are all structurally normal, with no evidence of dilitation or obstruction. Venous: The inferior vena cava was not well visualized. IAS/Shunts: The atrial septum is grossly normal.  LEFT VENTRICLE PLAX 2D LVIDd:         3.70 cm     Diastology LVIDs:         2.30 cm     LV e' medial:    7.15 cm/s LV PW:         1.10 cm     LV E/e' medial:  8.2 LV IVS:        1.30 cm     LV e' lateral:   9.64 cm/s LVOT diam:     2.20 cm     LV E/e' lateral: 6.1 LV SV:         78 LV SV Index:   37 LVOT Area:     3.80 cm  LV Volumes (MOD) LV vol d, MOD A2C: 91.9 ml LV vol d, MOD A4C: 90.8 ml LV vol s, MOD A2C: 38.3 ml LV vol s, MOD A4C: 32.7 ml LV SV MOD A2C:     53.6 ml LV SV MOD A4C:     90.8 ml LV SV MOD BP:      59.9 ml RIGHT VENTRICLE             IVC RV Basal diam:  3.70 cm     IVC diam: 1.80 cm RV S prime:     13.20 cm/s TAPSE (M-mode): 2.2 cm LEFT ATRIUM           Index        RIGHT ATRIUM           Index  LA diam:      3.10 cm 1.46 cm/m   RA Area:     10.70 cm LA Vol (A2C): 49.7 ml 23.35 ml/m  RA Volume:   20.00 ml  9.40 ml/m LA Vol (A4C): 63.7 ml 29.93 ml/m  AORTIC VALVE             PULMONIC VALVE LVOT Vmax:   104.00 cm/s PV Vmax:          1.00 m/s LVOT Vmean:  68.800 cm/s PV Peak grad:     4.0 mmHg LVOT VTI:    0.205 m     PR End Diast Vel: 4.16 msec  AORTA Ao Root diam: 3.70 cm Ao Asc diam:  3.30 cm MITRAL VALVE MV Area (PHT): 2.55 cm    SHUNTS MV Decel Time: 298 msec    Systemic VTI:  0.20 m MV E velocity: 58.50 cm/s  Systemic Diam: 2.20 cm MV A velocity: 72.70 cm/s MV E/A ratio:  0.80 Shelda Bruckner MD Electronically signed by Shelda Bruckner MD Signature Date/Time: 01/07/2024/2:08:28 PM    Final    DG Chest Port 1 View Result Date: 01/06/2024 CLINICAL DATA:  cp EXAM: PORTABLE CHEST 1 VIEW COMPARISON:  Chest x-ray 01/05/2024 FINDINGS: The heart and mediastinal contours are within normal limits. No focal consolidation. No pulmonary edema. No pleural effusion. No pneumothorax. No acute osseous abnormality. IMPRESSION: No active disease. Electronically Signed   By: Morgane  Naveau M.D.   On: 01/06/2024 21:52   CT Angio Chest Aorta W and/or Wo Contrast Result Date: 01/05/2024 CLINICAL DATA:  Chest pain, radiating to the neck and upper back, hypertension EXAM: CT ANGIOGRAPHY CHEST WITH CONTRAST TECHNIQUE: Multidetector CT imaging of the chest was performed using the standard protocol during bolus administration of intravenous contrast. Multiplanar CT image reconstructions and MIPs were obtained to evaluate the vascular anatomy. RADIATION DOSE REDUCTION: This exam was performed according to the departmental dose-optimization program which includes automated  exposure control, adjustment of the mA and/or kV according to patient size and/or use of iterative reconstruction technique. CONTRAST:  OMNIPAQUE  IOHEXOL  350 MG/ML SOLN COMPARISON:  Same day chest radiograph and prior studies  FINDINGS: Cardiovascular: No aortic intramural hemotoma. Preferential opacification of the thoracic aorta. No evidence of thoracic aortic aneurysm or dissection. Normal heart size. No pericardial effusion. Mediastinum/Nodes: No lymphadenopathy. Lungs/Pleura: Similar appearance of biapical scarring. Likely bibasilar subsegmental atelectasis. No pleural effusion or pneumothorax. Upper Abdomen: No acute findings. Musculoskeletal: No acute osseous findings. Review of the MIP images confirms the above findings. IMPRESSION: 1. No evidence of aortic dissection. No acute intrathoracic pathology identified. Electronically Signed   By: Michaeline Blanch M.D.   On: 01/05/2024 11:51   DG Chest Portable 1 View Result Date: 01/05/2024 CLINICAL DATA:  Diaphoresis and chest pain. EXAM: PORTABLE CHEST 1 VIEW COMPARISON:  11/07/2021 and CT chest 06/17/2019. FINDINGS: Trachea is midline. Heart size normal. Very minimal interstitial prominence in the lung bases. There may be trace left pleural fluid. IMPRESSION: 1. Very mild basilar interstitial prominence may be due to atelectasis or a viral/atypical pneumonia. Edema is less likely in the setting normal heart size. 2. Probable trace left pleural effusion. Electronically Signed   By: Newell Eke M.D.   On: 01/05/2024 09:33   CT Renal Stone Study Result Date: 12/12/2023 CLINICAL DATA:  Abdominal/flank pain, stone suspected EXAM: CT ABDOMEN AND PELVIS WITHOUT CONTRAST TECHNIQUE: Multidetector CT imaging of the abdomen and pelvis was performed following the standard protocol without IV contrast. RADIATION DOSE REDUCTION: This exam was performed according to the departmental dose-optimization program which includes automated exposure control, adjustment of the mA and/or kV according to patient size and/or use of iterative reconstruction technique. COMPARISON:  CT renal 12/08/2023 FINDINGS: Lower chest: No acute abnormality. Hepatobiliary: No focal liver abnormality. The gallbladder is  contracted with mild gallbladder wall thickening. No gallstones or pericholecystic fluid. No biliary dilatation. Pancreas: No focal lesion. Normal pancreatic contour. No surrounding inflammatory changes. No main pancreatic ductal dilatation. Spleen: Normal in size without focal abnormality. Adrenals/Urinary Tract: No adrenal nodule bilaterally. 4 mm calcified stone within the right kidney. Interval migration of a previously identified 2 mm right ureteropelvic junction stone now noted just distal to the right ureterovesicular junction. Associated mild fullness of the right collecting system with trace stranding along the ureter. No right ureterolithiasis. No left nephroureterolithiasis.  No left hydronephrosis. No definite contour-deforming renal mass. No ureterolithiasis or hydroureter. The urinary bladder is unremarkable. Stomach/Bowel: Stomach is within normal limits. No evidence of bowel wall thickening or dilatation. Colonic diverticulosis. Appendix appears normal. Vascular/Lymphatic: No abdominal aorta or iliac aneurysm. Mild atherosclerotic plaque of the aorta and its branches. No abdominal, pelvic, or inguinal lymphadenopathy. Reproductive: Prostate is unremarkable. Other: No intraperitoneal free fluid. No intraperitoneal free gas. No organized fluid collection. Musculoskeletal: No abdominal wall hernia or abnormality. No suspicious lytic or blastic osseous lesions. No acute displaced fracture. Bilateral sacroiliac joint degenerative changes. IMPRESSION: 1. Interval migration and likely recently passed 2 mm calcified stone just distal to the right ureterovesicular junction with associated mild fullness of the right collecting system. 2. Nonobstructive 4 mm right nephrolithiasis. 3. Contracted gallbladder with associated mild gallbladder wall thickening. Consider correlating with the liver function test. 4.  Aortic Atherosclerosis (ICD10-I70.0). Electronically Signed   By: Morgane  Naveau M.D.   On: 12/12/2023  22:07   CT Renal Stone Study Result Date: 12/08/2023 CLINICAL DATA:  Right flank pain EXAM: CT ABDOMEN AND PELVIS WITHOUT CONTRAST  TECHNIQUE: Multidetector CT imaging of the abdomen and pelvis was performed following the standard protocol without IV contrast. RADIATION DOSE REDUCTION: This exam was performed according to the departmental dose-optimization program which includes automated exposure control, adjustment of the mA and/or kV according to patient size and/or use of iterative reconstruction technique. COMPARISON:  04/08/2020 FINDINGS: Lower chest: Linear scarring in the lung bases.  No effusions. Hepatobiliary: No focal hepatic abnormality. Gallbladder unremarkable. Pancreas: No focal abnormality or ductal dilatation. Spleen: No focal abnormality.  Normal size. Adrenals/Urinary Tract: Adrenal glands normal. Mild right hydronephrosis due to 3 mm proximal right ureteral stone. 2 mm nonobstructing stone in the midpole of the right kidney. Mild right perinephric stranding. Urinary bladder unremarkable. Stomach/Bowel: Left colonic diverticulosis. No active diverticulitis. Stomach and small bowel decompressed. Normal appendix. Vascular/Lymphatic: No evidence of aneurysm or adenopathy. Reproductive: No visible focal abnormality. Other: No free fluid or free air. Musculoskeletal: No acute bony abnormality. IMPRESSION: 3 mm proximal right ureteral stone with mild right hydronephrosis and perinephric stranding. Punctate nonobstructing right nephrolithiasis. Left colonic diverticulosis. Electronically Signed   By: Franky Crease M.D.   On: 12/08/2023 21:24    Microbiology: Results for orders placed or performed during the hospital encounter of 12/13/23  Urine Culture     Status: None   Collection Time: 12/12/23  8:35 PM   Specimen: Urine, Clean Catch  Result Value Ref Range Status   Specimen Description   Final    URINE, CLEAN CATCH Performed at [facility], [address], [place], [place]  [address]    Special Requests   Final    NONE Performed at [facility], [address], [place], [place] [address]    Culture   Final    NO GROWTH Performed at [facility], [address] [place], [place] [address]    Report Status 12/14/2023 FINAL  Final    Labs: CBC: Recent Labs  Lab 01/05/24 0839 01/06/24 2141 01/07/24 0539  WBC 7.4 11.2* 12.8*  HGB 15.4 16.1 15.1  HCT 44.2 45.2 44.0  MCV 86.3 86.9 88.0  PLT 204 205 182   Basic Metabolic Panel: Recent Labs  Lab 01/05/24 0839 01/06/24 2141 01/07/24 0539  NA 139 140 139  K 3.4* 3.6 3.2*  CL 108 104 105  CO2 22 24 23   GLUCOSE 143* 145* 121*  BUN 19 22* 19  CREATININE 0.72 0.83 0.72  CALCIUM 8.5* 8.9 8.6*   Liver Function Tests: Recent Labs  Lab 01/07/24 0539  AST 20  ALT 19  ALKPHOS 99  BILITOT 0.5  PROT 6.2*  ALBUMIN 3.3*   CBG: No results for input(s): GLUCAP in the last 168 hours.  Discharge time spent: greater than 30 minutes.  Signed: Sabas GORMAN Brod, MD [facility] 01/07/2024

## 2024-01-07 NOTE — Progress Notes (Signed)
  Echocardiogram 2D Echocardiogram has been performed.  Trevor Franklin 01/07/2024, 10:45 AM

## 2024-01-07 NOTE — Progress Notes (Signed)
  Progress Note  Patient Name: Trevor Franklin Date of Encounter: 01/07/2024 [facility] Cardiologist: Paraschos   Interval Summary    Has been having intermittent chest pain over the past several days.  Has both typical and atypical symptoms.  Reports episodes worse with exertion but also tender to palpation across the anterior chest.  Vital Signs Vitals:   01/07/24 0700 01/07/24 0715 01/07/24 0730 01/07/24 0745  BP: 121/88 (!) 129/92 115/87 120/87  Pulse: 81 80 78 80  Resp: 17 16 16 15   Temp:      TempSrc:      SpO2: 96% 96% 94% 95%   No intake or output data in the 24 hours ending 01/07/24 0807    01/05/2024    8:15 AM 12/12/2023    8:34 PM 12/08/2023    8:30 PM  Last 3 Weights  Weight (lbs) 195 lb 195 lb 196 lb  Weight (kg) 88.451 kg 88.451 kg 88.905 kg      Telemetry/ECG   Sinus rhythm 70s- Personally Reviewed  Physical Exam  GEN: No acute distress.   Neck: No JVD Cardiac: RRR, no murmurs, rubs, or gallops.  Respiratory: Clear to auscultation bilaterally. GI: Soft, nontender, non-distended  MS: No edema  Assessment & Plan   53 y.o. male with a hx of paroxysmal A-fib since age 33, on Eliquis , hypertension, hyperlipidemia, mild to moderate CAD by cath 2023 50% D1 disease, medically managed, who was seen 01/07/2024 for the evaluation of chest pain at the request of Dr Lee.   Chest pain CAD -- Cardiac catheterization 2023 with 50% D1 disease managed medically -- Seen at [facility] 10/8 with chest pain, workup unremarkable.  Was started on Imdur 60 mg daily.  Picked up yesterday but has not started -- Presented to [facility] ED 10/9 with continued episodes of chest pain.  High-sensitivity troponin 7>>7, sinus rhythm 95 bpm left anterior fascicular block, LVH -- As above both typical and atypical symptoms noted.  Will proceed with coronary CTA this morning.  Further recommendations pending results -- Will stop aspirin  as he was not on this prior to admission, continue  statin, Imdur 60 mg daily -- echo pending  Paroxysmal atrial fibrillation -- Remains in sinus rhythm on metoprolol XL 50 mg daily, Eliquis  5 mg twice daily  Hypertension -- Elevated on arrival, improved to this morning -- Continue metoprolol XL 50 mg daily  Hyperlipidemia -- Check lipids -- Continue Crestor 40 mg daily  Tobacco use -- Cessation advised  Hypokalemia -- K + 3.2, supp  For questions or updates, please contact [facility] Please consult [email or web address] for contact info under    Signed, Manuelita Rummer, NP

## 2024-01-07 NOTE — Progress Notes (Signed)
CT FFR ordered.  

## 2024-01-07 NOTE — H&P (Signed)
 History and Physical    Trevor Franklin FMW:[id number] DOB: [birth year] DOA: 01/06/2024  PCP: Pcp, No   Patient coming from: Home   Chief Complaint:  Chief Complaint  Patient presents with   Chest Pain   ED TRIAGE note:Pt having left sided chest pain with sob worsening tonight. Was seen at [facility] for same and was discharged home. Pt having worsen chest pain tonight with increased sob and radiation into left arm which was the most worrisome. Pt appears uncomfortable, noted to be hypertensive   HPI:  Trevor Franklin is a 53 y.o. male with medical history significant of paroxysmal atrial fibrillation, CAD, essential hypertension, GERD, hyperlipidemia, generalized anxiety disorder and peripheral neuropathy presented to emergency department complaining for chest pain and chest heaviness that has been started for last 24 hours.  Patient has been seen at [facility] emergency department and per patient and wife at the bedside they wanted to admit hospital however they spoke with cardiologist who evaluated and recommended to discharge to home given patient has recent heart catheterization which is reassuring and as patient has flat troponin.  In the ED at [facility] cardiology recommended to follow-up with patient's cardiologist at [facility] and recommended to start Imdur.  During my evaluation at the bedside patient reported mid substernal constant chest pain and when he moves it rotates/radiates to further left and radiates to his arm.  Patient denies any palpitation or shortness of breath. Patient is very anxious regarding persistent chest pain.  ED Course:  At presentation to ED patient is hemodynamically stable. Troponin x 2 within normal range. CBC showed leukocytosis 11.2 otherwise unremarkable. BMP unremarkable. EKG showing normal sinus rhythm and there is no ST-T wave abnormality. Chest x-ray unremarkable. Patient has had [facility] ED 10/8 patient had a CTA which did not show any evidence of aortic  atherosclerosis 6 and no acute intrathoracic finding.  Per chart review patient has most recent heart cath in 11/2021:  Prox RCA lesion is 20% stenosed.   Mid RCA lesion is 15% stenosed.   Mid LAD lesion is 15% stenosed.   1st Diag lesion is 50% stenosed.   The left ventricular systolic function is normal.   The left ventricular ejection fraction is 55-65% by visual estimate.  1.  Insignificant coronary artery disease 2.  Normal left ventricular function  At [facility] the patient has been loaded with aspirin  324 mg and received multiple doses of sublingual nitroglycerin without resolution of the chest pain.  In the ED patient has been given topical nitroglycerin 15 mg.  Even patient received topical nitroglycerin chest pain is not improved however with IV morphine  chest pain has been improved in the ED.  Patient has chest pain reproducible at some places in the chest wall however continue to complaining about chest pain which is across the pericardium radiates to left-sided upper extremity tingling sensation.  ED provider consulted cardiology given patient has significant past cardiac history and atrial fibrillation cardiology planning for CT coronary study in the morning and if it is negative patient can be discharged home  Hospitalist has been consulted for further evaluation management of unstable angina.  Significant labs in the ED: Lab Orders         Basic metabolic panel         CBC         HIV Antibody (routine testing w rflx)         Comprehensive metabolic panel         CBC  Review of Systems:  Review of Systems  Constitutional:  Negative for chills.  Respiratory:  Negative for cough and shortness of breath.   Cardiovascular:  Positive for chest pain. Negative for palpitations, orthopnea and leg swelling.  Gastrointestinal:  Negative for abdominal pain and heartburn.  Musculoskeletal:  Negative for myalgias.  Neurological:  Negative for dizziness and headaches.   Psychiatric/Behavioral:  The patient is not nervous/anxious.     Past Medical History:  Diagnosis Date   A-fib (HCC)    Abdominal pain    Arrhythmia    GERD (gastroesophageal reflux disease)    Hyperlipidemia    Hypertension    Kidney stones     Past Surgical History:  Procedure Laterality Date   KNEE SURGERY     age 61   LEFT HEART CATH AND CORONARY ANGIOGRAPHY N/A 12/17/2021   Procedure: LEFT HEART CATH AND CORONARY ANGIOGRAPHY;  Surgeon: Ammon Blunt, MD;  Location: [facility] INVASIVE CV LAB;  Service: Cardiovascular;  Laterality: N/A;     reports that he has been smoking cigarettes. He has a 30 pack-year smoking history. He has never used smokeless tobacco. He reports current alcohol use. He reports that he does not use drugs.  Allergies  Allergen Reactions   Penicillins Other (See Comments)    Unknown from childhood    Family History  Problem Relation Age of Onset   Heart disease Mother    Heart attack Mother     Prior to Admission medications   Medication Sig Start Date End Date Taking? Authorizing Provider  apixaban  (ELIQUIS ) 5 MG TABS tablet Take 1 tablet (5 mg total) by mouth 2 (two) times daily. 08/02/18   Edelmiro Leash, MD  atorvastatin (LIPITOR) 40 MG tablet Take 40 mg by mouth every evening. 01/04/14   [provider]  diltiazem  (TIAZAC ) 240 MG 24 hr capsule Take 240 mg by mouth daily. 01/04/14   [provider]  isosorbide mononitrate (IMDUR) 30 MG 24 hr tablet Take 1 tablet (30 mg total) by mouth daily. 01/05/24   Dicky Anes, MD  omeprazole (PRILOSEC) 20 MG capsule Take 20 mg by mouth daily. 01/04/14   [provider]  ondansetron  (ZOFRAN  ODT) 4 MG disintegrating tablet Take 1 tablet (4 mg total) by mouth every 8 (eight) hours as needed for nausea or vomiting. 12/08/23   [name], MD  oxyCODONE  (ROXICODONE ) 5 MG immediate release tablet Take 1 tablet (5 mg total) by mouth every 8 (eight) hours as needed. 12/08/23 12/07/24  Tan,  Ting Xu, MD  POTASSIUM PO Take 595 mg by mouth daily.     [provider]  sucralfate  (CARAFATE ) 1 GM/10ML suspension Take 10 mLs (1 g total) by mouth 4 (four) times daily. 09/20/19 09/19/20  Vila Lauraine CROME., MD  tamsulosin  (FLOMAX ) 0.4 MG CAPS capsule Take 1 capsule (0.4 mg total) by mouth daily. 12/08/23   [name], MD     Physical Exam: Vitals:   01/06/24 2230 01/07/24 0115 01/07/24 0129 01/07/24 0557  BP: (!) 146/96 (!) 138/106    Pulse: 89 77    Resp: 19 19    Temp:   (!) 97.5 F (36.4 C) 97.7 F (36.5 C)  TempSrc:    Oral  SpO2: 94% 96%      Physical Exam Constitutional:      General: He is not in acute distress.    Appearance: He is not ill-appearing.  Cardiovascular:     Rate and Rhythm: Normal rate and regular rhythm.  Heart sounds: Normal heart sounds. No murmur heard.    No systolic murmur is present.     No diastolic murmur is present.     No friction rub.  Pulmonary:     Effort: Pulmonary effort is normal.     Breath sounds: Normal breath sounds.  Chest:     Chest wall: No tenderness.  Musculoskeletal:        General: Normal range of motion.  Skin:    Capillary Refill: Capillary refill takes less than 2 seconds.  Neurological:     Mental Status: He is alert and oriented to person, place, and time.  Psychiatric:        Mood and Affect: Mood normal.      Labs on Admission: I have personally reviewed following labs and imaging studies  CBC: Recent Labs  Lab 01/05/24 0839 01/06/24 2141  WBC 7.4 11.2*  HGB 15.4 16.1  HCT 44.2 45.2  MCV 86.3 86.9  PLT 204 205   Basic Metabolic Panel: Recent Labs  Lab 01/05/24 0839 01/06/24 2141  NA 139 140  K 3.4* 3.6  CL 108 104  CO2 22 24  GLUCOSE 143* 145*  BUN 19 22*  CREATININE 0.72 0.83  CALCIUM 8.5* 8.9   GFR: Estimated Creatinine Clearance: 116.3 mL/min (by C-G formula based on SCr of 0.83 mg/dL). Liver Function Tests: No results for input(s): AST, ALT, ALKPHOS, BILITOT,  PROT, ALBUMIN in the last 168 hours. No results for input(s): LIPASE, AMYLASE in the last 168 hours. No results for input(s): AMMONIA in the last 168 hours. Coagulation Profile: No results for input(s): INR, PROTIME in the last 168 hours. Cardiac Enzymes: Recent Labs  Lab 01/05/24 0839 01/05/24 1003 01/06/24 2141 01/07/24 0130  TROPONINIHS 6 5 7 7    BNP (last 3 results) No results for input(s): BNP in the last 8760 hours. HbA1C: No results for input(s): HGBA1C in the last 72 hours. CBG: No results for input(s): GLUCAP in the last 168 hours. Lipid Profile: No results for input(s): CHOL, HDL, LDLCALC, TRIG, CHOLHDL, LDLDIRECT in the last 72 hours. Thyroid Function Tests: No results for input(s): TSH, T4TOTAL, FREET4, T3FREE, THYROIDAB in the last 72 hours. Anemia Panel: No results for input(s): VITAMINB12, FOLATE, FERRITIN, TIBC, IRON, RETICCTPCT in the last 72 hours. Urine analysis:    Component Value Date/Time   COLORURINE YELLOW (A) 12/12/2023 2035   APPEARANCEUR CLOUDY (A) 12/12/2023 2035   APPEARANCEUR Hazy (A) 11/28/2019 1446   LABSPEC 1.010 12/12/2023 2035   PHURINE 5.0 12/12/2023 2035   GLUCOSEU NEGATIVE 12/12/2023 2035   HGBUR LARGE (A) 12/12/2023 2035   BILIRUBINUR NEGATIVE 12/12/2023 2035   BILIRUBINUR Negative 11/28/2019 1446   KETONESUR NEGATIVE 12/12/2023 2035   PROTEINUR NEGATIVE 12/12/2023 2035   NITRITE NEGATIVE 12/12/2023 2035   LEUKOCYTESUR TRACE (A) 12/12/2023 2035    Radiological Exams on Admission: I have personally reviewed images DG Chest Port 1 View Result Date: 01/06/2024 CLINICAL DATA:  cp EXAM: PORTABLE CHEST 1 VIEW COMPARISON:  Chest x-ray 01/05/2024 FINDINGS: The heart and mediastinal contours are within normal limits. No focal consolidation. No pulmonary edema. No pleural effusion. No pneumothorax. No acute osseous abnormality. IMPRESSION: No active disease. Electronically Signed   By:  Morgane  Naveau M.D.   On: 01/06/2024 21:52   CT Angio Chest Aorta W and/or Wo Contrast Result Date: 01/05/2024 CLINICAL DATA:  Chest pain, radiating to the neck and upper back, hypertension EXAM: CT ANGIOGRAPHY CHEST WITH CONTRAST TECHNIQUE: Multidetector CT imaging of the  chest was performed using the standard protocol during bolus administration of intravenous contrast. Multiplanar CT image reconstructions and MIPs were obtained to evaluate the vascular anatomy. RADIATION DOSE REDUCTION: This exam was performed according to the departmental dose-optimization program which includes automated exposure control, adjustment of the mA and/or kV according to patient size and/or use of iterative reconstruction technique. CONTRAST:  OMNIPAQUE  IOHEXOL  350 MG/ML SOLN COMPARISON:  Same day chest radiograph and prior studies FINDINGS: Cardiovascular: No aortic intramural hemotoma. Preferential opacification of the thoracic aorta. No evidence of thoracic aortic aneurysm or dissection. Normal heart size. No pericardial effusion. Mediastinum/Nodes: No lymphadenopathy. Lungs/Pleura: Similar appearance of biapical scarring. Likely bibasilar subsegmental atelectasis. No pleural effusion or pneumothorax. Upper Abdomen: No acute findings. Musculoskeletal: No acute osseous findings. Review of the MIP images confirms the above findings. IMPRESSION: 1. No evidence of aortic dissection. No acute intrathoracic pathology identified. Electronically Signed   By: Michaeline Blanch M.D.   On: 01/05/2024 11:51   DG Chest Portable 1 View Result Date: 01/05/2024 CLINICAL DATA:  Diaphoresis and chest pain. EXAM: PORTABLE CHEST 1 VIEW COMPARISON:  11/07/2021 and CT chest 06/17/2019. FINDINGS: Trachea is midline. Heart size normal. Very minimal interstitial prominence in the lung bases. There may be trace left pleural fluid. IMPRESSION: 1. Very mild basilar interstitial prominence may be due to atelectasis or a viral/atypical pneumonia. Edema  is less likely in the setting normal heart size. 2. Probable trace left pleural effusion. Electronically Signed   By: Newell Eke M.D.   On: 01/05/2024 09:33     EKG: My personal interpretation of EKG shows:     Assessment/Plan: Principal Problem:   Unstable angina (HCC) Active Problems:   Paroxysmal atrial fibrillation (HCC)   Hyperlipidemia   History of CAD (coronary artery disease)   Essential hypertension   GERD (gastroesophageal reflux disease)   Generalized anxiety disorder   Peripheral neuropathy    Assessment and Plan: No notes have been filed under this hospital service. Service: Hospitalist Unstable angina History of CAD -Presented emergency department complaining of persistent precordial chest pain with radiation to the left sided arm with tingling sensation.  Patient initially went to [facility] ED further workup revealed flat troponin and cardiology evaluated patient given patient has previous nonsignificant CAD recommended medical management and recommended to start Imdur 50 mg daily.  During that time patient has been loaded with aspirin  and CTA chest ruled out PE, aortic atherosclerosis and intrathoracic abnormality. - However patient continued to have chest pain.  In the ED patient has been given a topical nitroglycerin 15 mg which did not resolve the chest pain however with morphine  4 mg chest pain completely subsided.  Patient also has some component of reproducible chest wall pain as well. -Troponin x 2 within normal range. CBC showed leukocytosis 11.2 otherwise unremarkable. BMP unremarkable. -EKG showing normal sinus rhythm and there is no ST-T wave abnormality. -Chest x-ray unremarkable. -EKG and troponin is reassuring.  At this time there is no concern for acute coronary syndrome. -ED provider discussed case with on-call cardiology and cardiology recommended plan for CT coronary angiography.  If it is negative there is no further intervention needs. - Keeping  patient n.p.o. - Continue aspirin  81 mg daily, Crestor, Eliquis  5 mg twice daily, Toprol-XL 100 mg daily. -Starting Imdur 60 mg daily. -Obtain echocardiogram. - Continue cardiac monitoring.  Paroxysmal atrial fibrillation -Continue Toprol-XL 100 mg daily and Eliquis  5 mg twice daily  Essential hypertension -Continue Toprol-XL and Imdur  Generalized anxiety disorder -  Continue Lexapro    DVT prophylaxis:  Eliquis , SCD and TED hose Code Status:  Full Code Diet: N.p.o. for CT coronary angiogram Family Communication:   Family was present at bedside, at the time of interview. Opportunity was given to ask question and all questions were answered satisfactorily.  Disposition Plan: Continue to monitor improvement of chest pain. Consults: Cardiology Admission status:   Inpatient, Telemetry bed  Severity of Illness: The appropriate patient status for this patient is INPATIENT. Inpatient status is judged to be reasonable and necessary in order to provide the required intensity of service to ensure the patient's safety. The patient's presenting symptoms, physical exam findings, and initial radiographic and laboratory data in the context of their chronic comorbidities is felt to place them at high risk for further clinical deterioration. Furthermore, it is not anticipated that the patient will be medically stable for discharge from the hospital within 2 midnights of admission.   * I certify that at the point of admission it is my clinical judgment that the patient will require inpatient hospital care spanning beyond 2 midnights from the point of admission due to high intensity of service, high risk for further deterioration and high frequency of surveillance required.Trevor    Jory Welke, MD [facility]  How to contact the [facility] Attending or Consulting provider 7A - 7P or covering provider during after hours 7P -7A, for this patient.  Check the care team in [facility] and look for a)  attending/consulting [facility] provider listed and b) the [facility] team listed Log into [email or web address] and use [employer] universal password to access. If you do not have the password, please contact the hospital operator. Locate the [facility] provider you are looking for under [facility] and page to a number that you can be directly reached. If you still have difficulty reaching the provider, please page the [facility] (Director on Call) for the Hospitalists listed on amion for assistance.  01/07/2024, 6:04 AM

## 2024-01-07 NOTE — Consult Note (Signed)
 Cardiology Consultation   Patient ID: Trevor Franklin MRN: [id number]; DOB: 08/02/1970  Admit date: 01/06/2024 Date of Consult: 01/07/2024  PCP:  Freddrick, No   [facility] Providers Cardiologist:  None        Patient Profile: Trevor Franklin is a 53 y.o. male with a hx of paroxysmal A-fib since age 31, on Eliquis , hypertension, hyperlipidemia, mild to moderate CAD by cath 2023 50% D1 disease, medically managed, who is being seen 01/07/2024 for the evaluation of chest pain at the request of Dr Lee.  History of Present Illness: Trevor Franklin is a 53 y.o. male with a hx of paroxysmal A-fib since age 88, on Eliquis , hypertension, hyperlipidemia, mild to moderate CAD by cath 2023 50% D1 disease, medically managed, who is being seen 01/07/2024 for the evaluation of chest pain   Patient reports started having chest heaviness after working, he works in Art gallery manager job.  Dominantly, but chest pain was getting worse with movements, worse when he lifts his head or tries to get out of the bed also has some exertional chest pain and then started having left-sided hand numbness tingling and pain just came to the ER. Upon reviewing his chart he has been seen at [facility] system last seen 2024 has history of A-fib on Eliquis , frequent PVCs maintained on metoprolol and diltiazem , echocardiogram and normal function, prior exercise Myoview was negative, last cath 2023 revealed a D1 50% disease which is medically managed. He continues to smoke cigarettes, Is currently in normal sinus rhythm.  In the ER his blood pressure was up but then improved. EKG shows normal sinus rhythm Troponin x 2 negative. CTA chest angio no PE or dissection,  Currently has some mild chest pain which gets worse with him trying to lift his head up and movement in the bed   LHC 2023   Prox RCA lesion is 20% stenosed.   Mid RCA lesion is 15% stenosed.   Mid LAD lesion is 15% stenosed.   1st Diag lesion is 50%  stenosed.   The left ventricular systolic function is normal.   The left ventricular ejection fraction is 55-65% by visual estimate.   1.  Insignificant coronary artery disease 2.  Normal left ventricular function   Recommendations   1.  Medical therapy 2.  Continue risk factor modification 3.  Resume Eliquis  4.  Follow-up in 1 week  NM stress test 2023 normal LV function, mild apical ischemia no regional wall motion abnormality  Echo 2020 EF preserved, 60%, no valvular problems      Past Medical History:  Diagnosis Date   A-fib (HCC)    Abdominal pain    Arrhythmia    GERD (gastroesophageal reflux disease)    Hyperlipidemia    Hypertension    Kidney stones     Past Surgical History:  Procedure Laterality Date   KNEE SURGERY     age 34   LEFT HEART CATH AND CORONARY ANGIOGRAPHY N/A 12/17/2021   Procedure: LEFT HEART CATH AND CORONARY ANGIOGRAPHY;  Surgeon: Ammon Blunt, MD;  Location: [facility] INVASIVE CV LAB;  Service: Cardiovascular;  Laterality: N/A;     Home Medications:  Prior to Admission medications   Medication Sig Start Date End Date Taking? Authorizing Provider  apixaban  (ELIQUIS ) 5 MG TABS tablet Take 1 tablet (5 mg total) by mouth 2 (two) times daily. 08/02/18  Yes Edelmiro, [place], MD  metoprolol succinate (TOPROL-XL) 50 MG 24 hr tablet Take 50-100 mg by mouth See  admin instructions. Take 2 tablets by mouth in the morning and 1 tablet in the evening   Yes [provider]  omeprazole (PRILOSEC) 20 MG capsule Take 20 mg by mouth daily. 01/04/14  Yes [provider]  potassium chloride  (KLOR-CON ) 10 MEQ tablet Take 10 mEq by mouth 2 (two) times daily. 11/22/23 11/21/24 Yes [provider]  rosuvastatin (CRESTOR) 40 MG tablet Take 1 tablet by mouth at bedtime. 11/06/19  Yes [provider]  tamsulosin  (FLOMAX ) 0.4 MG CAPS capsule Take 1 capsule (0.4 mg total) by mouth daily. 12/08/23  Yes [name], MD  TIADYLT  ER 180 MG 24  hr capsule Take 180 mg by mouth daily.   Yes [provider]  atorvastatin (LIPITOR) 40 MG tablet Take 40 mg by mouth every evening. Patient not taking: Reported on 01/07/2024 01/04/14   [provider]    Scheduled Meds:  apixaban   5 mg Oral BID   aspirin  EC  81 mg Oral Daily   escitalopram  10 mg Oral Daily   isosorbide mononitrate  60 mg Oral Daily   metoprolol succinate  100 mg Oral Daily   pantoprazole  40 mg Oral Daily   rosuvastatin  40 mg Oral Daily   sodium chloride  flush  3 mL Intravenous Q12H   sodium chloride  flush  3 mL Intravenous Q12H   Continuous Infusions:  sodium chloride      PRN Meds: sodium chloride , acetaminophen  **OR** acetaminophen , ondansetron  **OR** ondansetron  (ZOFRAN ) IV, sodium chloride  flush  Allergies:    Allergies  Allergen Reactions   Penicillins Other (See Comments)    Unknown from childhood    Social History:   Social History   Socioeconomic History   Marital status: Married    Spouse name: Not on file   Number of children: Not on file   Years of education: Not on file   Highest education level: Not on file  Occupational History   Not on file  Tobacco Use   Smoking status: Every Day    Current packs/day: 1.00    Average packs/day: 1 pack/day for 30.0 years (30.0 ttl pk-yrs)    Types: Cigarettes   Smokeless tobacco: Never  Vaping Use   Vaping status: Never Used  Substance and Sexual Activity   Alcohol use: Yes    Comment: social   Drug use: No   Sexual activity: Not on file  Other Topics Concern   Not on file  Social History Narrative   Not on file   Social Drivers of Health   Financial Resource Strain: Not on file  Food Insecurity: Not on file  Transportation Needs: Not on file  Physical Activity: Not on file  Stress: Not on file  Social Connections: Not on file  Intimate Partner Violence: Not on file    Family History:    Family History  Problem Relation Age of Onset   Heart disease Mother     Heart attack Mother      ROS:  Please see the history of present illness.   All other ROS reviewed and negative.     Physical Exam/Data: Vitals:   01/06/24 2136 01/06/24 2230 01/07/24 0115 01/07/24 0129  BP: (!) 160/103 (!) 146/96 (!) 138/106   Pulse: 96 89 77   Resp: (!) 28 19 19    Temp: 98.6 F (37 C)   (!) 97.5 F (36.4 C)  SpO2: 100% 94% 96%    No intake or output data in the 24 hours ending  01/07/24 0537    01/05/2024    8:15 AM 12/12/2023    8:34 PM 12/08/2023    8:30 PM  Last 3 Weights  Weight (lbs) 195 lb 195 lb 196 lb  Weight (kg) 88.451 kg 88.451 kg 88.905 kg     There is no height or weight on file to calculate BMI.  General:  Well nourished, well developed, in no acute distress HEENT: normal Neck: no JVD Vascular: No carotid bruits; Distal pulses 2+ bilaterally Cardiac:  normal S1, S2; RRR; no murmur  Lungs:  clear to auscultation bilaterally, no wheezing, rhonchi or rales  Abd: soft, nontender, no hepatomegaly  Ext: no edema Musculoskeletal:  No deformities, BUE and BLE strength normal and equal Skin: warm and dry  Neuro:  CNs 2-12 intact, no focal abnormalities noted Psych:  Normal affect    Relevant CV Studies:   Laboratory Data: High Sensitivity Troponin:   Recent Labs  Lab 01/05/24 0839 01/05/24 1003 01/06/24 2141 01/07/24 0130  TROPONINIHS 6 5 7 7      Chemistry Recent Labs  Lab 01/05/24 0839 01/06/24 2141  NA 139 140  K 3.4* 3.6  CL 108 104  CO2 22 24  GLUCOSE 143* 145*  BUN 19 22*  CREATININE 0.72 0.83  CALCIUM 8.5* 8.9  GFRNONAA >60 >60  ANIONGAP 9 12    No results for input(s): PROT, ALBUMIN, AST, ALT, ALKPHOS, BILITOT in the last 168 hours. Lipids No results for input(s): CHOL, TRIG, HDL, LABVLDL, LDLCALC, CHOLHDL in the last 168 hours.  Hematology Recent Labs  Lab 01/05/24 0839 01/06/24 2141  WBC 7.4 11.2*  RBC 5.12 5.20  HGB 15.4 16.1  HCT 44.2 45.2  MCV 86.3 86.9  MCH 30.1 31.0  MCHC  34.8 35.6  RDW 12.5 12.8  PLT 204 205   Thyroid No results for input(s): TSH, FREET4 in the last 168 hours.  BNPNo results for input(s): BNP, PROBNP in the last 168 hours.  DDimer No results for input(s): DDIMER in the last 168 hours.  Radiology/Studies:  DG Chest Port 1 View Result Date: 01/06/2024 CLINICAL DATA:  cp EXAM: PORTABLE CHEST 1 VIEW COMPARISON:  Chest x-ray 01/05/2024 FINDINGS: The heart and mediastinal contours are within normal limits. No focal consolidation. No pulmonary edema. No pleural effusion. No pneumothorax. No acute osseous abnormality. IMPRESSION: No active disease. Electronically Signed   By: Morgane  Naveau M.D.   On: 01/06/2024 21:52   CT Angio Chest Aorta W and/or Wo Contrast Result Date: 01/05/2024 CLINICAL DATA:  Chest pain, radiating to the neck and upper back, hypertension EXAM: CT ANGIOGRAPHY CHEST WITH CONTRAST TECHNIQUE: Multidetector CT imaging of the chest was performed using the standard protocol during bolus administration of intravenous contrast. Multiplanar CT image reconstructions and MIPs were obtained to evaluate the vascular anatomy. RADIATION DOSE REDUCTION: This exam was performed according to the departmental dose-optimization program which includes automated exposure control, adjustment of the mA and/or kV according to patient size and/or use of iterative reconstruction technique. CONTRAST:  OMNIPAQUE  IOHEXOL  350 MG/ML SOLN COMPARISON:  Same day chest radiograph and prior studies FINDINGS: Cardiovascular: No aortic intramural hemotoma. Preferential opacification of the thoracic aorta. No evidence of thoracic aortic aneurysm or dissection. Normal heart size. No pericardial effusion. Mediastinum/Nodes: No lymphadenopathy. Lungs/Pleura: Similar appearance of biapical scarring. Likely bibasilar subsegmental atelectasis. No pleural effusion or pneumothorax. Upper Abdomen: No acute findings. Musculoskeletal: No acute osseous findings. Review of  the MIP images confirms the above findings. IMPRESSION: 1. No evidence of  aortic dissection. No acute intrathoracic pathology identified. Electronically Signed   By: Michaeline Blanch M.D.   On: 01/05/2024 11:51   DG Chest Portable 1 View Result Date: 01/05/2024 CLINICAL DATA:  Diaphoresis and chest pain. EXAM: PORTABLE CHEST 1 VIEW COMPARISON:  11/07/2021 and CT chest 06/17/2019. FINDINGS: Trachea is midline. Heart size normal. Very minimal interstitial prominence in the lung bases. There may be trace left pleural fluid. IMPRESSION: 1. Very mild basilar interstitial prominence may be due to atelectasis or a viral/atypical pneumonia. Edema is less likely in the setting normal heart size. 2. Probable trace left pleural effusion. Electronically Signed   By: Newell Eke M.D.   On: 01/05/2024 09:33     Assessment and Plan: Chest pain, atypical, (LHC 2023: 50% D1) Paroxysmal A-fib on Eliquis . Hypertension, hyperlipidemia, frequent PVCs. Tobacco use disorder  Plan: -> Patient's EKG is negative, enzymes are negative, atypical chest pain, could be musculoskeletal and pleuritic in nature, but he does have some exertional chest pain and also had some mild disease back in 2023. Obtain CCTA for further restratification. Obtain echocardiogram in a.m. Continue his home medication metoprolol, Eliquis , diltiazem  and other home medication.  Will follow-up  Risk Assessment/Risk Scores:         CHA2DS2-VASc Score =   3  This indicates a  % annual risk of stroke. The patient's score is based upon:          For questions or updates, please contact [facility] Please consult [email or web address] for contact info under     Signed, Grayce Bold, MD  01/07/2024 5:37 AM

## 2024-01-07 NOTE — Progress Notes (Signed)
 Subjective: Patient admitted this morning, see detailed H&P by Dr Lee 53 y.o. male with medical history significant of paroxysmal atrial fibrillation, CAD, essential hypertension, GERD, hyperlipidemia, generalized anxiety disorder and peripheral neuropathy presented to emergency department complaining for chest pain and chest heaviness that has been started for last 24 hours.  Patient has been seen at [facility] emergency department and per patient and wife at the bedside they wanted to admit hospital however they spoke with cardiologist who evaluated and recommended to discharge to home given patient has recent heart catheterization which is reassuring and as patient has flat troponin.  In the ED at [facility] cardiology recommended to follow-up with patient's cardiologist at [facility] and recommended to start Imdur.   During my evaluation at the bedside patient reported mid substernal constant chest pain and when he moves it rotates/radiates to further left and radiates to his arm.  Patient denies any palpitation or shortness of breath. Patient is very anxious regarding persistent chest pain.  Vitals:   01/07/24 0557 01/07/24 0600  BP:  122/89  Pulse:  80  Resp:  18  Temp: 97.7 F (36.5 C)   SpO2:  93%      A/P  Unstable angina History of CAD -Presented emergency department complaining of persistent precordial chest pain with radiation to the left sided arm with tingling sensation.  Patient initially went to [facility] ED further workup revealed flat troponin and cardiology evaluated patient given patient has previous nonsignificant CAD recommended medical management and recommended to start Imdur 50 mg daily.  During that time patient has been loaded with aspirin  and CTA chest ruled out PE, aortic atherosclerosis and intrathoracic abnormality. - However patient continued to have chest pain.  In the ED patient has been given a topical nitroglycerin 15 mg which did not resolve the chest pain however with  morphine  4 mg chest pain completely subsided.  Patient also has some component of reproducible chest wall pain as well. -Troponin x 2 within normal range. CBC showed leukocytosis 11.2 otherwise unremarkable. BMP unremarkable. -EKG showing normal sinus rhythm and there is no ST-T wave abnormality. -Chest x-ray unremarkable. -EKG and troponin is reassuring.  At this time there is no concern for acute coronary syndrome. -ED provider discussed case with on-call cardiology and cardiology recommended plan for CT coronary angiography.  If it is negative there is no further intervention needs. - Keeping patient n.p.o. - Continue aspirin  81 mg daily, Crestor, Eliquis  5 mg twice daily, Toprol-XL 100 mg daily. -Starting Imdur 60 mg daily. -Obtain echocardiogram. - Continue cardiac monitoring.   Paroxysmal atrial fibrillation -Continue Toprol-XL 100 mg daily and Eliquis  5 mg twice daily   Essential hypertension -Continue Toprol-XL and Imdur   Generalized anxiety disorder -Continue Lexapro     Kaleen Rochette S [place] [facility]

## 2024-01-07 NOTE — ED Notes (Signed)
 Called [facility] and put pt on monitor

## 2024-01-10 ENCOUNTER — Encounter: Payer: Self-pay | Admitting: Family Medicine

## 2024-01-10 ENCOUNTER — Ambulatory Visit: Admitting: Family Medicine

## 2024-01-10 VITALS — BP 155/90

## 2024-01-10 DIAGNOSIS — R040 Epistaxis: Secondary | ICD-10-CM

## 2024-01-10 DIAGNOSIS — I25110 Atherosclerotic heart disease of native coronary artery with unstable angina pectoris: Secondary | ICD-10-CM | POA: Insufficient documentation

## 2024-01-10 DIAGNOSIS — E876 Hypokalemia: Secondary | ICD-10-CM | POA: Insufficient documentation

## 2024-01-10 DIAGNOSIS — Z87442 Personal history of urinary calculi: Secondary | ICD-10-CM

## 2024-01-10 DIAGNOSIS — E782 Mixed hyperlipidemia: Secondary | ICD-10-CM | POA: Diagnosis not present

## 2024-01-10 DIAGNOSIS — N400 Enlarged prostate without lower urinary tract symptoms: Secondary | ICD-10-CM | POA: Insufficient documentation

## 2024-01-10 DIAGNOSIS — I480 Paroxysmal atrial fibrillation: Secondary | ICD-10-CM

## 2024-01-10 DIAGNOSIS — F17200 Nicotine dependence, unspecified, uncomplicated: Secondary | ICD-10-CM

## 2024-01-10 DIAGNOSIS — Z7689 Persons encountering health services in other specified circumstances: Secondary | ICD-10-CM

## 2024-01-10 DIAGNOSIS — R079 Chest pain, unspecified: Secondary | ICD-10-CM | POA: Diagnosis not present

## 2024-01-10 DIAGNOSIS — K219 Gastro-esophageal reflux disease without esophagitis: Secondary | ICD-10-CM

## 2024-01-10 MED ORDER — OMEPRAZOLE 20 MG PO CPDR: 20.0000 mg | DELAYED_RELEASE_CAPSULE | Freq: Every day | ORAL | 3 refills | Status: AC

## 2024-01-10 MED ORDER — TAMSULOSIN HCL 0.4 MG PO CAPS
0.4000 mg | ORAL_CAPSULE | Freq: Every day | ORAL | 3 refills | Status: AC
Start: 2024-01-10 — End: ?

## 2024-01-10 MED ORDER — TIADYLT ER 180 MG PO CP24
180.0000 mg | ORAL_CAPSULE | Freq: Every day | ORAL | 3 refills | Status: AC
Start: 2024-01-10 — End: ?

## 2024-01-10 MED ORDER — ROSUVASTATIN CALCIUM 40 MG PO TABS: 40.0000 mg | ORAL_TABLET | Freq: Every day | ORAL | 3 refills | Status: AC

## 2024-01-10 MED ORDER — POTASSIUM CHLORIDE ER 10 MEQ PO TBCR
10.0000 meq | EXTENDED_RELEASE_TABLET | Freq: Two times a day (BID) | ORAL | 3 refills | Status: AC
Start: 2024-01-10 — End: 2025-01-09

## 2024-01-10 MED ORDER — APIXABAN 5 MG PO TABS: 5.0000 mg | ORAL_TABLET | Freq: Two times a day (BID) | ORAL | 3 refills | Status: AC

## 2024-01-10 NOTE — Progress Notes (Signed)
 New Patient Office Visit  Introduced to nurse practitioner role and practice setting.  All questions answered.  Discussed provider/patient relationship and expectations.   Subjective    Patient ID: Trevor Franklin, male    DOB: [birth year]  Age: 53 y.o. MRN: [id number]  CC:  Chief Complaint  Patient presents with   New Patient (Initial Visit)    NP.SABRA Chest pains wedns.and nose bleeds this morning    HPI  Discussed the use of AI scribe software for clinical note transcription with the patient, who gave verbal consent to proceed.  History of Present Illness ADELBERT Franklin is a 53 year old male with CAD, atrial fibrillation and hypertension who presents to establish care with new PCP as previous retired. Does have concerns today for two weeks of chest pain and nose bleed this morning. He is accompanied by his wife.  He has been experiencing chest pain for almost two weeks, described as a steady discomfort rated at 3 out of 10, located in the chest and radiating to the back and lower shoulder. The pain worsens with physical activity and lying down, and he describes it as feeling like being 'kicked by a horse in the chest.' This type of pain is new for him, although he has experienced palpitations and a racing heart in the past. No loss of consciousness or syncope. He went to the ED on 01/06/24 for this concern, where he was started on Imdur for unstable angina. - pt has not continued this medication due to ill side effects. The chest pain in the ED - he was given topical nitroglycerin and morphine , the later resolving the chest per ED note. Troponin were normal. ECHO was normal and CT cards - negative for  significant coronary artery stenosis per ED note. He is following up with cardiology this afternoon  He has a history of atrial fibrillation. He takes apixaban  (Eliquis ) twice daily for atrial fibrillation. Two years ago, he underwent a cardiac catheterization that showed some stenosis but not  enough to warrant intervention. He has had episodes of rapid heart rate requiring adenosine administration in the past.  Recently, he experienced an unprovoked epistaxis lasting five to six minutes this morning, which has never occurred before. Additionally, he notes that his skin has become thinner over the past year, leading to easier bruising.  HTN - blood pressure have been more elevated per patient, but ED visit 100s/70s. But patient states at home BP upwards of 170s/100s. Today = 155/90  He has a history of high cholesterol and CAD managed with rosuvastatin 40 mg daily, and gastric reflux, for which he takes omeprazole. He recently experienced kidney stones and has a history of diverticulitis.  Family history is significant for heart disease, with his mother passing away at 74 from a massive heart attack and a sister who died from heart disease nearly two years ago. His father is alive but has multiple health issues. One of his sons has heart and mental health problems.  He smokes about a pack of cigarettes a day and has attempted to quit in the past using Chantix, which caused him to pass out. He drinks alcohol infrequently, about twice a year during social occasions like cruises.   Outpatient Encounter Medications as of 01/10/2024  Medication Sig   isosorbide mononitrate (IMDUR) 60 MG 24 hr tablet Take 1 tablet (60 mg total) by mouth daily.   metoprolol succinate (TOPROL-XL) 50 MG 24 hr tablet Take 1 tablet (50 mg total) by  mouth daily. (Patient taking differently: Take 50 mg by mouth daily. Tabke two tablets in morning and one in evening)   [DISCONTINUED] apixaban  (ELIQUIS ) 5 MG TABS tablet Take 1 tablet (5 mg total) by mouth 2 (two) times daily.   [DISCONTINUED] omeprazole (PRILOSEC) 20 MG capsule Take 20 mg by mouth daily.   [DISCONTINUED] potassium chloride  (KLOR-CON ) 10 MEQ tablet Take 10 mEq by mouth 2 (two) times daily.   [DISCONTINUED] rosuvastatin (CRESTOR) 40 MG tablet Take 1  tablet by mouth at bedtime.   [DISCONTINUED] tamsulosin  (FLOMAX ) 0.4 MG CAPS capsule Take 1 capsule (0.4 mg total) by mouth daily.   [DISCONTINUED] TIADYLT  ER 180 MG 24 hr capsule Take 180 mg by mouth daily.   apixaban  (ELIQUIS ) 5 MG TABS tablet Take 1 tablet (5 mg total) by mouth 2 (two) times daily.   omeprazole (PRILOSEC) 20 MG capsule Take 1 capsule (20 mg total) by mouth daily.   potassium chloride  (KLOR-CON ) 10 MEQ tablet Take 1 tablet (10 mEq total) by mouth 2 (two) times daily.   rosuvastatin (CRESTOR) 40 MG tablet Take 1 tablet (40 mg total) by mouth at bedtime.   tamsulosin  (FLOMAX ) 0.4 MG CAPS capsule Take 1 capsule (0.4 mg total) by mouth daily.   TIADYLT  ER 180 MG 24 hr capsule Take 1 capsule (180 mg total) by mouth daily.   No facility-administered encounter medications on file as of 01/10/2024.    Past Medical History:  Diagnosis Date   A-fib (HCC)    Abdominal pain    Arrhythmia    GERD (gastroesophageal reflux disease)    Hyperlipidemia    Hypertension    Kidney stones     Past Surgical History:  Procedure Laterality Date   KNEE SURGERY     age 70   LEFT HEART CATH AND CORONARY ANGIOGRAPHY N/A 12/17/2021   Procedure: LEFT HEART CATH AND CORONARY ANGIOGRAPHY;  Surgeon: Ammon Blunt, MD;  Location: [facility] INV[facility]IVE [facility];  Service: Cardiovascular;  Laterality: N/A;    Family History  Problem Relation Age of Onset   Heart disease Mother    Heart attack Mother    Heart disease Sister    Heart disease Son    Mental illness Son    GER disease Son     Social History   Socioeconomic History   Marital status: Married    Spouse name: Not on file   Number of children: Not on file   Years of education: Not on file   Highest education level: Not on file  Occupational History   Not on file  Tobacco Use   Smoking status: Every Day    Current packs/day: 1.00    Average packs/day: 1 pack/day for 30.0 years (30.0 ttl pk-yrs)    Types: Cigarettes   Smokeless  tobacco: Never  Vaping Use   Vaping status: Never Used  Substance and Sexual Activity   Alcohol use: Yes    Comment: social   Drug use: No   Sexual activity: Not on file  Other Topics Concern   Not on file  Social History Narrative   Not on file   Social Drivers of Health   Financial Resource Strain: Low Risk  (01/07/2024)   Overall Financial Resource Strain (CARDIA)    Difficulty of Paying Living Expenses: Not hard at all  Food Insecurity: No Food Insecurity (01/07/2024)   Hunger Vital Sign    Worried About Running Out of Food in the Last Year: Never true    Ran  Out of Food in the Last Year: Never true  Transportation Needs: No Transportation Needs (01/07/2024)   PRAPARE - Administrator, Civil Service (Medical): No    Lack of Transportation (Non-Medical): No  Physical Activity: Unknown (01/07/2024)   Exercise Vital Sign    Days of Exercise per Week: Patient declined    Minutes of Exercise per Session: Not on file  Stress: Patient Declined (01/07/2024)   [employer] of Occupational Health - Occupational Stress Questionnaire    Feeling of Stress: Patient declined  Social Connections: Moderately Isolated (01/07/2024)   Social Connection and Isolation Panel    Frequency of Communication with Friends and Family: More than three times a week    Frequency of Social Gatherings with Friends and Family: Patient declined    Attends Religious Services: Patient declined    Database administrator or Organizations: No    Attends Engineer, structural: Not on file    Marital Status: Married  Catering manager Violence: Not At Risk (01/07/2024)   Humiliation, Afraid, Rape, and Kick questionnaire    Fear of Current or Ex-Partner: No    Emotionally Abused: No    Physically Abused: No    Sexually Abused: No    ROS      Objective    There were no vitals taken for this visit.  Physical Exam Constitutional:      General: He is not in acute distress.     Appearance: Normal appearance. He is not ill-appearing, toxic-appearing or diaphoretic.  HENT:     Head: Normocephalic.     Nose: Nose normal. No congestion or rhinorrhea.     Comments: Dried blood, no obviously source of bleeding    Mouth/Throat:     Mouth: Mucous membranes are moist.  Eyes:     Extraocular Movements: Extraocular movements intact.     Conjunctiva/sclera: Conjunctivae normal.     Pupils: Pupils are equal, round, and reactive to light.  Neck:     Vascular: No carotid bruit.  Cardiovascular:     Rate and Rhythm: Normal rate and regular rhythm.     Heart sounds: No murmur heard.    No friction rub. No gallop.  Pulmonary:     Effort: Pulmonary effort is normal. No respiratory distress.     Breath sounds: Normal breath sounds. No stridor. No wheezing, rhonchi or rales.  Chest:     Chest wall: No tenderness.  Musculoskeletal:     Right lower leg: No edema.     Left lower leg: No edema.  Lymphadenopathy:     Cervical: No cervical adenopathy.  Skin:    General: Skin is warm and dry.     Capillary Refill: Capillary refill takes less than 2 seconds.  Neurological:     General: No focal deficit present.     Mental Status: He is alert and oriented to person, place, and time. Mental status is at baseline.     Cranial Nerves: No cranial nerve deficit.     Sensory: No sensory deficit.     Motor: No weakness.     Coordination: Coordination normal.     Gait: Gait normal.  Psychiatric:        Mood and Affect: Mood normal.        Behavior: Behavior normal.        Thought Content: Thought content normal.        Judgment: Judgment normal.        EKG: normal EKG,  normal sinus rhythm, unchanged from previous tracings, left axis deviation. HR = 64  Assessment & Plan:  Assessment and Plan Assessment & Plan Chest Pain - on going for two weeks - ED worked patient up for unstable angina - worsened with exertion and lying down. Pain radiates to the back and shoulder, rated  3/10 currently. Previous cardiac catheterization two years ago showed some stenosis but not enough for intervention per patient.  Left Heart Cath = 12/17/21:    Prox RCA lesion is 20% stenosed.   Mid RCA lesion is 15% stenosed.   Mid LAD lesion is 15% stenosed.   1st Diag lesion is 50% stenosed.   The left ventricular systolic function is normal.   The left ventricular ejection fraction is 55-65% by visual estimate.  - EKG today, lots of tremor/ artifact, in NSR, no ST elevation noted, HR = 64, appears failry unchanged from ED visit - cards appt at 2pm today. - Recent ER visit with nitroglycerin administration relieved symptoms but caused hypotension.  -Concerning for worsening coronary artery disease - Cardiologist follow-up scheduled for further evaluation. - Follow up with cardiology for further evaluation and potential diagnostic testing.  CAD On rosuvastatin 40mg  daily - has cards appoint this afternoon  Atrial fibrillation, currently in sinus rhythm Currently in sinus rhythm. Previously required adenosine for conversion. On apixaban  for anticoagulation. - hx of RVR per patient - was told in ED to reduce metoprolol bc of low BP(100s/70) - pt does not want to decrease bc of heart rate and palpitation hx long discussion - refer to cardiology's recommendations - Continue apixaban  5mg  BID - continue metoprolol ER  100mg  in am and 50mg  - previous PCP prescribed. - continue diltiazem  180mg  - Recommend speaking with cardiology  Hypertension Blood pressure elevated at home (176/101) and in office (155/90). Current medications primarily prescribed for patient's afib rate control rather than blood pressure management. Discussed potential need for antihypertensive medication such as losartan or lisinopril. Decision to await cardiology input before initiating new medication. GOAL<130/80 - monitor at home with upper arm cuff, left side - metoprolol and diltiazem  - Discuss blood pressure  management with cardiology. - Consider starting antihypertensive medication based on cardiology recommendations. - DASH diet - tobacco cessation  Hyperlipidemia - Continue rosuvastatin 40 mg daily.  Gastroesophageal reflux disease (GERD) Managed with omeprazole. Reports intermittent chest pain for two weeks, with recent severe episode leading to ER visit - cardiac related to angina - Continue omeprazole as prescribed.  Enlarged Prostate/ Hx of kidney stones - Continue tamsulosin  as prescribed.  Chronic hypokalemia Chronic low potassium levels, managed with oral potassium supplementation. No recent symptoms of hypokalemia reported. - Continue potassium supplementation as prescribed.  Tobacco use disorder Currently smoking one pack per day. Previous use of Chantix resulted in syncope. Discussed potential use of Wellbutrin for smoking cessation. Encouraged reduction in smoking and provided information on tobacco cessation resources. - Encourage reduction in smoking. - Provide information on 1-800-QUIT-NOW for smoking cessation support. - Consider Wellbutrin for smoking cessation if interested.  Epistaxis, resolved - spontaneous, non traumatic Recent episode of epistaxis lasting 5-6 minutes, resolved spontaneously. No recurrent episodes. Possible contributing factors include elevated blood pressure and anticoagulation therapy, on eliquis  - Could use afrin if reoccurs, but given recent angina - do not recommend - if happens again - ENT referral for potential scope for cause - Discuss with cardiology if recurrent.  General Health Maintenance/ New Doc Colonoscopy due in December 2025. Discussed importance of tobacco cessation for overall  health improvement. - Place referral for colonoscopy.   Mixed hyperlipidemia -     Rosuvastatin Calcium; Take 1 tablet (40 mg total) by mouth at bedtime.  Dispense: 90 tablet; Refill: 3  Paroxysmal atrial fibrillation (HCC) -     Apixaban ; Take 1  tablet (5 mg total) by mouth 2 (two) times daily.  Dispense: 180 tablet; Refill: 3 -     Tiadylt  ER; Take 1 capsule (180 mg total) by mouth daily.  Dispense: 90 capsule; Refill: 3  Enlarged prostate -     Tamsulosin  HCl; Take 1 capsule (0.4 mg total) by mouth daily.  Dispense: 90 capsule; Refill: 3  Gastroesophageal reflux disease without esophagitis -     Omeprazole; Take 1 capsule (20 mg total) by mouth daily.  Dispense: 90 capsule; Refill: 3  Hypokalemia -     Potassium Chloride  ER; Take 1 tablet (10 mEq total) by mouth 2 (two) times daily.  Dispense: 180 tablet; Refill: 3    Return in about 2 weeks (around 01/24/2024) for HTN;/.   Curtis DELENA Boom, FNP

## 2024-01-24 ENCOUNTER — Ambulatory Visit: Admitting: Family Medicine

## 2024-01-24 ENCOUNTER — Encounter: Payer: Self-pay | Admitting: Family Medicine

## 2024-01-24 VITALS — BP 120/83 | HR 61 | Temp 98.1°F | Ht 73.0 in | Wt 201.8 lb

## 2024-01-24 DIAGNOSIS — F17200 Nicotine dependence, unspecified, uncomplicated: Secondary | ICD-10-CM

## 2024-01-24 DIAGNOSIS — N400 Enlarged prostate without lower urinary tract symptoms: Secondary | ICD-10-CM

## 2024-01-24 DIAGNOSIS — K219 Gastro-esophageal reflux disease without esophagitis: Secondary | ICD-10-CM

## 2024-01-24 DIAGNOSIS — I209 Angina pectoris, unspecified: Secondary | ICD-10-CM

## 2024-01-24 DIAGNOSIS — I25110 Atherosclerotic heart disease of native coronary artery with unstable angina pectoris: Secondary | ICD-10-CM

## 2024-01-24 DIAGNOSIS — E876 Hypokalemia: Secondary | ICD-10-CM | POA: Diagnosis not present

## 2024-01-24 DIAGNOSIS — I471 Supraventricular tachycardia, unspecified: Secondary | ICD-10-CM

## 2024-01-24 DIAGNOSIS — I480 Paroxysmal atrial fibrillation: Secondary | ICD-10-CM

## 2024-01-24 DIAGNOSIS — I10 Essential (primary) hypertension: Secondary | ICD-10-CM | POA: Diagnosis not present

## 2024-01-24 DIAGNOSIS — Z2821 Immunization not carried out because of patient refusal: Secondary | ICD-10-CM

## 2024-01-24 DIAGNOSIS — E782 Mixed hyperlipidemia: Secondary | ICD-10-CM

## 2024-01-24 MED ORDER — METOPROLOL SUCCINATE ER 50 MG PO TB24: 50.0000 mg | ORAL_TABLET | Freq: Every day | ORAL | 1 refills | Status: AC

## 2024-01-24 NOTE — Progress Notes (Signed)
 Established Patient Office Visit  Introduced to nurse practitioner role and practice setting.  All questions answered.  Discussed provider/patient relationship and expectations.  Subjective   Patient ID: Trevor Franklin, male    DOB: [birth year]  Age: 53 y.o. MRN: [id number]  Chief Complaint  Patient presents with   Follow-up    Patient is here today to follow up in 2 weeks since the last visit on 01/10/2024 associated with chest pain.  States that his chest pain is better, reports he has constant headaches.  All vaccines declined.    Discussed the use of AI scribe software for clinical note transcription with the patient, who gave verbal consent to proceed.  History of Present Illness Trevor Franklin is a 53 year old male with hypertension and coronary artery disease who presents for follow-up on HTN and recent chest pain.   His chest pain has improved since the last visit, occurring only occasionally. He carries nitroglycerin (prescribed by cards )for episodes of chest pain but has not needed to use it recently. His cardiologist adjusted his diltiazem  dosage for blood pressure  He is currently taking diltiazem  240 mg, metoprolol succinate 150 mg daily (100 mg in the morning and 50 mg at night), and Eliquis  5 mg twice daily. There is some confusion regarding the metoprolol dosage, as it is typically a 24-hour extended-release tablet, but he has been taking it in a split dose for years.  He has a history of smoking and has recently switched from [employer] to [employer], although his daily consumption remains the same. He is considering further reducing his nicotine intake by transitioning to ultra-light cigarettes and eventually quitting.  He is taking potassium supplements twice a day due to previously low potassium levels, which were recorded at 3.2 01/07/24.      01/24/2024    8:21 AM 01/10/2024    2:34 PM  Depression screen PHQ 2/9  Decreased Interest 0 0  Down,  Depressed, Hopeless 0 0  PHQ - 2 Score 0 0  Altered sleeping 2 2  Tired, decreased energy 2 2  Change in appetite 2 0  Feeling bad or failure about yourself  0 0  Trouble concentrating 0 0  Moving slowly or fidgety/restless 0 0  Suicidal thoughts 0 0  PHQ-9 Score 6 4  Difficult doing work/chores Not difficult at all Not difficult at all       01/24/2024    8:21 AM 01/10/2024    2:37 PM  GAD 7 : Generalized Anxiety Score  Nervous, Anxious, on Edge 2 2  Control/stop worrying 1 1  Worry too much - different things 1 1  Trouble relaxing 1 1  Restless 1 0  Easily annoyed or irritable 1 0  Afraid - awful might happen 0 0  Total GAD 7 Score 7 5  Anxiety Difficulty Not difficult at all Not difficult at all     ROS  Negative unless indicated in HPI   Objective:     BP 120/83 (BP Location: Left Arm, Patient Position: Sitting, Cuff Size: Normal)   Pulse 61   Temp 98.1 F (36.7 C) (Oral)   Ht 6' 1 (1.854 m)   Wt 201 lb 12.8 oz (91.5 kg)   SpO2 97%   BMI 26.62 kg/m    Physical Exam Constitutional:      General: He is not in acute distress.    Appearance: Normal appearance. He is not ill-appearing, toxic-appearing or diaphoretic.  HENT:  Head: Normocephalic.  Eyes:     Extraocular Movements: Extraocular movements intact.     Pupils: Pupils are equal, round, and reactive to light.  Cardiovascular:     Rate and Rhythm: Normal rate and regular rhythm.     Pulses: Normal pulses.     Heart sounds: Normal heart sounds. No murmur heard.    No friction rub. No gallop.  Pulmonary:     Effort: Pulmonary effort is normal. No respiratory distress.     Breath sounds: Normal breath sounds. No stridor. No wheezing, rhonchi or rales.  Chest:     Chest wall: No tenderness.  Musculoskeletal:     Right lower leg: No edema.     Left lower leg: No edema.  Skin:    General: Skin is warm and dry.     Capillary Refill: Capillary refill takes less than 2 seconds.  Neurological:      General: No focal deficit present.     Mental Status: He is alert and oriented to person, place, and time. Mental status is at baseline.     Cranial Nerves: No cranial nerve deficit.     Sensory: No sensory deficit.     Motor: No weakness.     Coordination: Coordination normal.     Gait: Gait normal.      No results found for any visits on 01/24/24.    The 10-year ASCVD risk score (Arnett DK, et al., 2019) is: 14.6%    Assessment & Plan:  Essential hypertension  SVT (supraventricular tachycardia) -     Metoprolol Succinate ER; Take 1 tablet (50 mg total) by mouth daily. Take two tablets in morning and one at night for heart rate.  Dispense: 360 tablet; Refill: 1  Paroxysmal atrial fibrillation (HCC) -     Metoprolol Succinate ER; Take 1 tablet (50 mg total) by mouth daily. Take two tablets in morning and one at night for heart rate.  Dispense: 360 tablet; Refill: 1  Hypokalemia -     Comprehensive metabolic panel with GFR  Tobacco use disorder  Mixed hyperlipidemia  Coronary artery disease involving native heart with unstable angina pectoris, unspecified vessel or lesion type (HCC)  Angina pectoris     Assessment and Plan Assessment & Plan Angina pectoris Intermittent chest pain, improved since last visit.  Currently managed with nitroglycerin as needed. - Use nitroglycerin as needed for chest pain - managed by cards. - Follow up with cardiology as scheduled.  Paroxysmal Atrial fibrillation & SVT Managed with metoprolol, diltiazem  and Eliquis . Current regimen includes metoprolol succinate 50 mg ER, taken as 100 mg in the morning and 50 mg in the evening. Cardiologist to confirm dosing regimen. Heart rate stable, auscultation regular rate. No murmurs heard - Continue metoprolol succinate 50 mg ER, 2 tablets in the morning and 1 tablet in the evening. - Continue Eliquis  5 mg twice daily. - Continue metoprolol 100mg  am and 50mg  PM - pt is on ER, odd to be on  BID. Confirm with cardiology correct dosing - pt states previous PCP prescribed. Given chronic okay to continue, but discuss with cards as well.  - Diltiazem    Hypertension Blood pressure improved since last visit.  Managed with current medication regimen including diltiazem  and metoprolol. GOAL<130/80 - Continue diltiazem  240mg  daily - managed by cards - continue metoprolol 100 am and 50 pm - chronic prescription from previous provider  CAD and HLD - rosuvastatin 40mg  daily - sees cards  BPH - continue Tamsulosin  0.4mg  daily  GERD - continue omeprazole 20mg  daily  Hypokalemia Previous potassium level was 3.2. Currently taking potassium 10meq supplements twice daily. - Order potassium level today to reassess.  Tobacco use Continues to smoke [employer], working on reducing nicotine intake with a plan to switch to ultra lights and eventually quit. Smoking can worsen chest pain and increase risk of cardiovascular events. - Encourage continued reduction in nicotine intake.   Declines Flu Vaccine today   Return in about 3 months (around 04/25/2024) for chronic disease mgmt.   I, Curtis DELENA Boom, FNP, have reviewed all documentation for this visit. The documentation on 01/24/24 for the exam, diagnosis, procedures, and orders are all accurate and complete.   Curtis DELENA Boom, FNP

## 2024-01-25 ENCOUNTER — Ambulatory Visit: Payer: Self-pay | Admitting: Family Medicine

## 2024-01-25 LAB — COMPREHENSIVE METABOLIC PANEL WITH GFR
ALT: 18 IU/L (ref 0–44)
AST: 17 IU/L (ref 0–40)
Albumin: 4.2 g/dL (ref 3.8–4.9)
Alkaline Phosphatase: 135 IU/L — ABNORMAL HIGH (ref 47–123)
BUN/Creatinine Ratio: 23 — ABNORMAL HIGH (ref 9–20)
BUN: 18 mg/dL (ref 6–24)
Bilirubin Total: 0.5 mg/dL (ref 0.0–1.2)
CO2: 22 mmol/L (ref 20–29)
Calcium: 9.3 mg/dL (ref 8.7–10.2)
Chloride: 107 mmol/L — ABNORMAL HIGH (ref 96–106)
Creatinine, Ser: 0.78 mg/dL (ref 0.76–1.27)
Globulin, Total: 2.7 g/dL (ref 1.5–4.5)
Glucose: 106 mg/dL — ABNORMAL HIGH (ref 70–99)
Potassium: 3.9 mmol/L (ref 3.5–5.2)
Sodium: 144 mmol/L (ref 134–144)
Total Protein: 6.9 g/dL (ref 6.0–8.5)
eGFR: 106 mL/min/1.73 (ref >59)

## 2024-04-25 ENCOUNTER — Encounter

## 2024-04-25 ENCOUNTER — Ambulatory Visit

## 2024-06-22 ENCOUNTER — Encounter
